# Patient Record
Sex: Female | Born: 1970
Health system: Southern US, Community
[De-identification: ages and names within clinical notes are randomized; demographics above are authoritative.]

## PROBLEM LIST (undated history)

## (undated) DIAGNOSIS — F419 Anxiety disorder, unspecified: Secondary | ICD-10-CM

## (undated) DIAGNOSIS — K654 Sclerosing mesenteritis: Secondary | ICD-10-CM

## (undated) DIAGNOSIS — F32A Depression, unspecified: Secondary | ICD-10-CM

## (undated) DIAGNOSIS — M329 Systemic lupus erythematosus, unspecified: Secondary | ICD-10-CM

## (undated) DIAGNOSIS — IMO0002 Reserved for concepts with insufficient information to code with codable children: Secondary | ICD-10-CM

## (undated) DIAGNOSIS — F329 Major depressive disorder, single episode, unspecified: Secondary | ICD-10-CM

## (undated) HISTORY — DX: Systemic lupus erythematosus, unspecified: M32.9

## (undated) HISTORY — DX: Depression, unspecified: F32.A

## (undated) HISTORY — DX: Sclerosing mesenteritis: K65.4

## (undated) HISTORY — DX: Anxiety disorder, unspecified: F41.9

## (undated) HISTORY — DX: Reserved for concepts with insufficient information to code with codable children: IMO0002

---
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
## 2017-01-21 HISTORY — PX: ESOPHAGOGASTRODUODENOSCOPY: SHX1529

## 2017-02-18 HISTORY — PX: COLONOSCOPY: SHX174

## 2019-09-20 MED FILL — NITROFURANTOIN MONO-MCR 100: 100 | 7 days supply | Qty: 14 | Fill #0

## 2019-10-04 ENCOUNTER — Other Ambulatory Visit: Payer: Self-pay

## 2019-10-04 ENCOUNTER — Ambulatory Visit (INDEPENDENT_AMBULATORY_CARE_PROVIDER_SITE_OTHER): Admitting: Nurse Practitioner

## 2019-10-04 ENCOUNTER — Encounter: Payer: Self-pay | Admitting: Nurse Practitioner

## 2019-10-04 VITALS — BP 126/72 | HR 74 | Temp 98.0°F | Ht 64.0 in | Wt 146.0 lb

## 2019-10-04 DIAGNOSIS — F419 Anxiety disorder, unspecified: Secondary | ICD-10-CM | POA: Insufficient documentation

## 2019-10-04 DIAGNOSIS — F32A Depression, unspecified: Secondary | ICD-10-CM

## 2019-10-04 DIAGNOSIS — K648 Other hemorrhoids: Secondary | ICD-10-CM | POA: Diagnosis not present

## 2019-10-04 DIAGNOSIS — F329 Major depressive disorder, single episode, unspecified: Secondary | ICD-10-CM

## 2019-10-04 DIAGNOSIS — K625 Hemorrhage of anus and rectum: Secondary | ICD-10-CM

## 2019-10-04 DIAGNOSIS — K654 Sclerosing mesenteritis: Secondary | ICD-10-CM

## 2019-10-04 DIAGNOSIS — M329 Systemic lupus erythematosus, unspecified: Secondary | ICD-10-CM | POA: Diagnosis not present

## 2019-10-04 NOTE — Progress Notes (Signed)
10/04/2019 Amanda Mcconnell [id number] [birth year]   HISTORY OF PRESENT ILLNESS: Amanda Mcconnell is a 49 year old female with a past medical history of anxiety, depression and Lupus on Plaquenil. Past C section. She has a history of upper abdominal pain and weight loss for which she underwent an extensive evaluation in 2018.  Abdominal/pelvic CT imaging 11/04/2016 identified mesenteric lymphadenopathy.  She underwent an EGD by Dr. Chales Abrahams 01/21/2017 which identified a small hiatal hernia and mild gastritis.  She underwent a colonoscopy 02/18/2017 which identified small internal hemorrhoids otherwise was normal.  She continued to have surveillance CTs.  She was referred to the [facility] and she underwent an exploratory laparotomy with excision of a mesenteric mass, removal and biopsy of a mesenteric lymph node by Dr. Loraine Leriche Arrendondo 06/21/2019. Biopsies identified  sclerosing mesenteritis.  She was then prescribed Prednisone 7.5 mg once daily.  She presents today with complaints of rectal bleeding.  She reported seeing red blood on the toilet paper and in the toilet water with a few blood clots for 2 days last week.  She last saw bright red blood on the toilet tissue on Sunday 10/02/2019.  She reports having minor anal irritation without significant anal or rectal pain.  She denies NSAID use.  Her bowel movements vary.  She reports passing a bowel movement most days.  Her stool consistency varies, she might pass a normal formed stool, hard or soft stool.  She has chronic periumbilical pain associated with her mesenteritis.  No new abdominal pain.  She complains of having right lower back pain which started the last week of December 2020.  She was treated for possible UTI with Macrobid 1 p.o. twice daily for 7 days in mid December.  She denies having any dysuria or hematuria at this time.  Her water intake is extremely low.  No alcohol or drug use.  She drinks 1 liter of Dr. Reino Kent daily, sometimes more.  She drinks  2 to 4 cups of caffeinated coffee daily.  She smokes 1 pack of cigarettes daily since age of 58.  Her fiber intake is low.  She reports eating junk food several days weekly.  She does eat some vegetables and fruits during the week but not daily.  Reports having heartburn for the past 4 weeks.  She is taking Omeprazole 20 mg once daily which she started a few weeks ago and her heartburn has improved.  No dysphagia.  No weight loss.  No fever but has occasional night sweats.  Her father died from lung or liver cancer with metastasis at the age of 75.  Brother was diagnosed with Crohn's disease in his late 64s to early 18s. No other complaints today.   Current Outpatient Medications on File Prior to Visit  Medication Sig Dispense Refill  . amphetamine-dextroamphetamine (ADDERALL) 30 MG tablet Take by mouth daily.    . citalopram (CELEXA) 20 MG tablet Take 40 mg by mouth daily.    . hydroxychloroquine (PLAQUENIL) 200 MG tablet Take 200 mg by mouth daily.    . predniSONE (DELTASONE) 5 MG tablet Take 7.5 mg by mouth daily with breakfast.     No current facility-administered medications on file prior to visit.  No Known Allergies   REVIEW OF SYSTEMS  : All other systems reviewed and negative except where noted in the History of Present Illness.   PHYSICAL EXAM: BP 126/72   Pulse 74   Temp 98 F (36.7 C)   Ht 5\' 4"  (  1.626 m)   Wt 146 lb (66.2 kg)   BMI 25.06 kg/m  General: Well developed 49 year old female in no acute distress. Head: Normocephalic and atraumatic. Eyes:  Sclerae non-icteric, conjunctive pink. Ears: Normal auditory acuity. Neck: Supple, no lymphadenopathy or thyromegaly.  Lungs: Clear bilaterally to auscultation without wheezes, crackles or rhonchi. Heart: Regular rate and rhythm, no murmur. No rubs or gallops appreciated.  Abdomen: Soft, nondistended, periumbilical tenderness without rebound or guarding.  No masses. No hepatosplenomegaly. Normoactive bowel sounds x 4  quadrants.  Midline abdominal scar intact.  No CVA tenderness. Rectal: No anal fissure assessed.  No external hemorrhoids.  Small internal hemorrhoids palpated right greater than left.  No blood or stool in the rectal vault. Musculoskeletal: Symmetrical with no gross deformities. Skin: Warm and dry. No rash or lesions on visible extremities. Extremities: No edema. Neurological: Alert oriented x 4, no focal deficits.  Psychological:  Alert and cooperative. Normal mood and affect.  ASSESSMENT AND PLAN:  92. 49 year old female with rectal bleeding.  Internal hemorrhoids on exam today without evidence of active bleeding. -Increase water intake to 48 glasses daily -MiraLAX 1 capful mixed in 8 ounces of water at bedtime -Anusol suppository 25 mg 1 PR nightly x5 nights, apply a small amount of Desitin inside the anal opening 2-3 times daily as needed -CBC -Follow-up in office in 4 weeks, to further discuss possible internal  hemorrhoid banding and a flexible sigmoidoscopy to assess above the site of hemorrhoids for cause of rectal bleeding  2.  GERD -Continue Omeprazole 20 mg once daily -GERD diet discussed  3.  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 Elta Guadeloupe Arrendondo at [facility] and Rheumatologist Dr. Annabelle Harman in [place]. On Prednisone, possible plans to start Azathioprine in the near future.   4. History of ? Lupus. ANA negative. 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 Annabelle Harman.  CC:  No ref. provider found

## 2019-10-04 NOTE — Patient Instructions (Addendum)
If you are age 51 or older, your body mass index should be between 23-30. Your Body mass index is 25.06 kg/m. If this is out of the aforementioned range listed, please consider follow up with your Primary Care Provider.  If you are age 38 or younger, your body mass index should be between 19-25. Your Body mass index is 25.06 kg/m. If this is out of the aformentioned range listed, please consider follow up with your Primary Care Provider.   Your provider has requested that you have lab work today. We ask that you go to our [facility] office at [address], [place], [place] [address]. Please enter through the main entrance and go to the elevator.  Press "B" on the elevator. The lab is located at the first door on the left as you exit the elevator.   Increase your water intake by 4-8 glasses of water daily. Increase your daily fiber. Use Miralax 1capful mixed om 8 ounces of water daily  We have sent the following medications to your pharmacy for you to pick up at your convenience:  Anusol suppositories  Use 1 suppository per rectum nightly for 5 days.  Continue with omeprazole 20mg  I daily as needed Use destin apply a small amount inside the anal opening 2-3 times daily as needed.  Follow up in 4-6 weeks to further discuss hemorrhoid banding and a flexible sigmoidoscopy.  Call our office if your symptoms worsen.   Due to recent changes in healthcare laws, you may see the results of your imaging and laboratory studies on MyChart before your provider has had a chance to review them.  We understand that in some cases there may be results that are confusing or concerning to you. Not all laboratory results come back in the same time frame and the provider may be waiting for multiple results in order to interpret others.  Please give 48 hours in order for your provider to thoroughly review all the results before contacting the office for clarification of your results.

## 2019-10-09 NOTE — Progress Notes (Signed)
Excellent plan At follow-up, if hemorrhoid banding is needed, we would set that up with Dr. Barron Alvine. RG

## 2019-11-08 ENCOUNTER — Ambulatory Visit: Admitting: Nurse Practitioner

## 2019-12-07 ENCOUNTER — Other Ambulatory Visit (HOSPITAL_BASED_OUTPATIENT_CLINIC_OR_DEPARTMENT_OTHER): Payer: Self-pay | Admitting: Rheumatology

## 2019-12-07 DIAGNOSIS — K654 Sclerosing mesenteritis: Secondary | ICD-10-CM

## 2019-12-07 DIAGNOSIS — R1900 Intra-abdominal and pelvic swelling, mass and lump, unspecified site: Secondary | ICD-10-CM

## 2019-12-15 ENCOUNTER — Other Ambulatory Visit: Payer: Self-pay

## 2019-12-15 ENCOUNTER — Encounter (HOSPITAL_BASED_OUTPATIENT_CLINIC_OR_DEPARTMENT_OTHER): Payer: Self-pay

## 2019-12-15 ENCOUNTER — Ambulatory Visit (HOSPITAL_BASED_OUTPATIENT_CLINIC_OR_DEPARTMENT_OTHER)
Admission: RE | Admit: 2019-12-15 | Discharge: 2019-12-15 | Disposition: A | Discharge status: Home / Self Care | Source: Ambulatory Visit | Attending: Rheumatology | Admitting: Rheumatology

## 2019-12-15 DIAGNOSIS — K654 Sclerosing mesenteritis: Secondary | ICD-10-CM | POA: Insufficient documentation

## 2019-12-15 DIAGNOSIS — R1900 Intra-abdominal and pelvic swelling, mass and lump, unspecified site: Secondary | ICD-10-CM | POA: Insufficient documentation

## 2019-12-15 IMAGING — CT CT ABD-PELV W/ CM
2 of 5 series · 16 of 46 positions shown, 18 images · IV contrast (Omnipaque)
Comparison: CT of the abdomen pelvis dated [DATE].

CLINICAL DATA: 48-year-old female with history of sclerosing
mesenteritis presenting with abdominal distension and rectal
bleeding.

EXAM:
CT ABDOMEN AND PELVIS WITH CONTRAST
TECHNIQUE: Multidetector CT imaging of the abdomen and pelvis was performed
using the standard protocol following bolus administration of
intravenous contrast.
CONTRAST:  100mL OMNIPAQUE IOHEXOL 300 MG/ML  SOLN

[Series 2: axial st · axial · 0.86mm/px · z∈[-472,-72]mm · 13 of 90 slices shown, 15 images]
[im 5/90  soft-tissue]
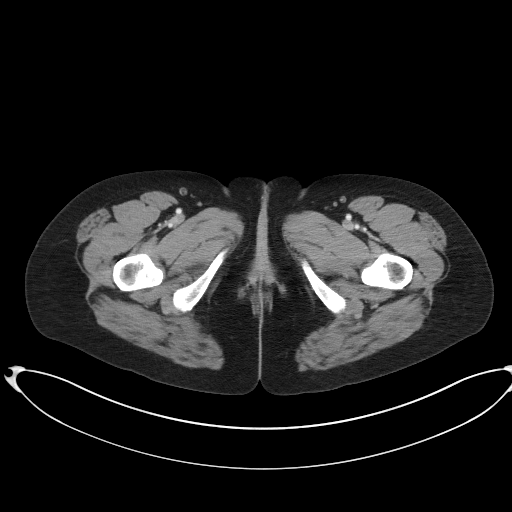
[im 5/90  bone]
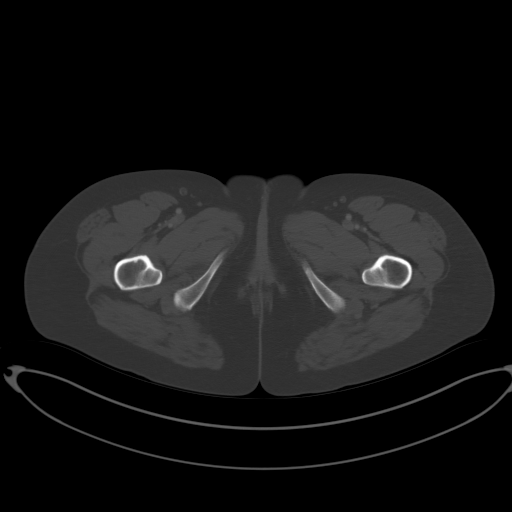
[im 10/90  soft-tissue]
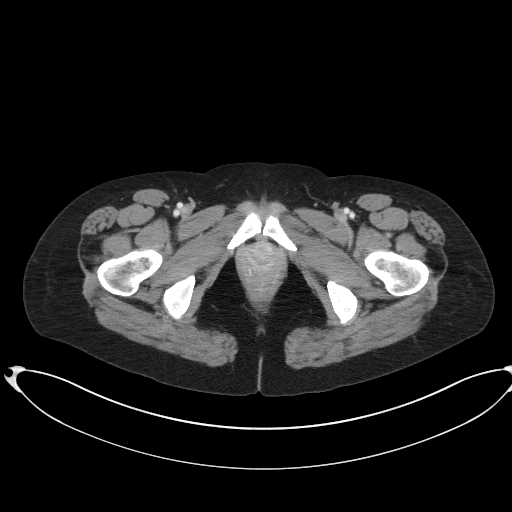
[im 20/90  soft-tissue]
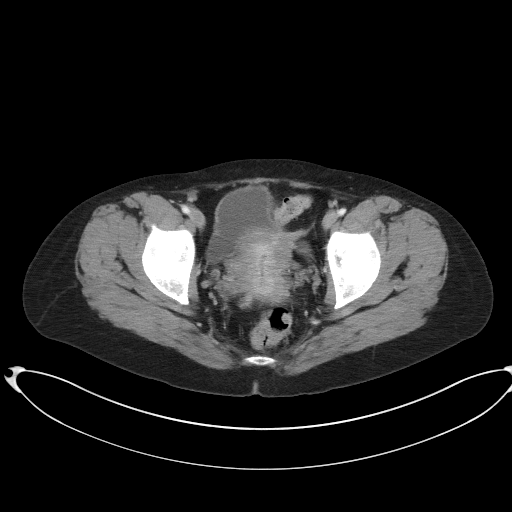
[im 25/90  soft-tissue]
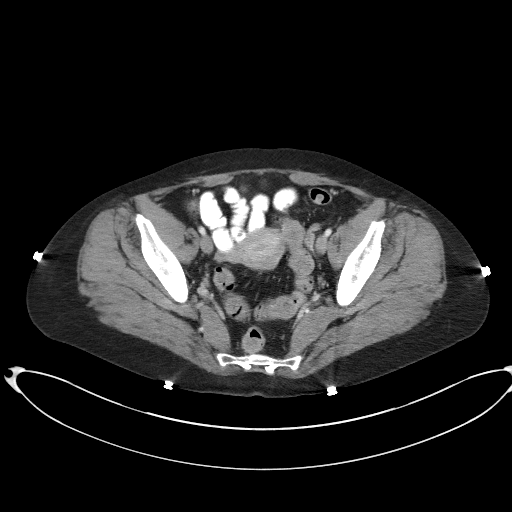
[im 30/90  soft-tissue]
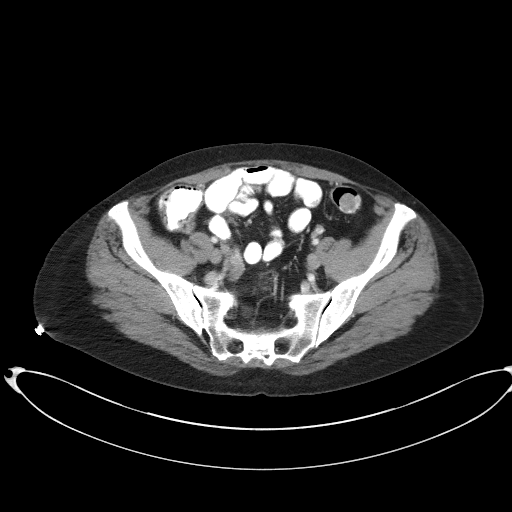
[im 40/90  soft-tissue]
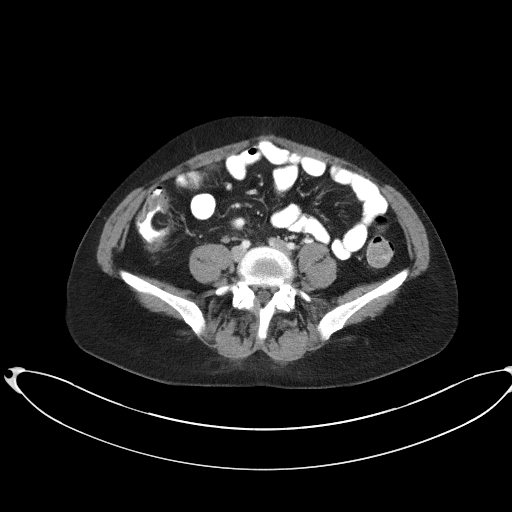
[im 45/90  soft-tissue]
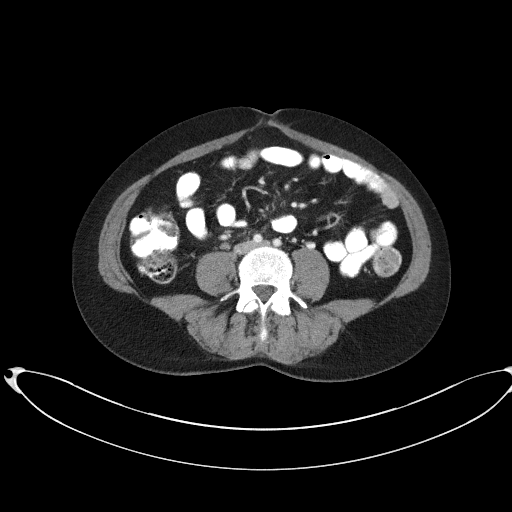
[im 50/90  soft-tissue]
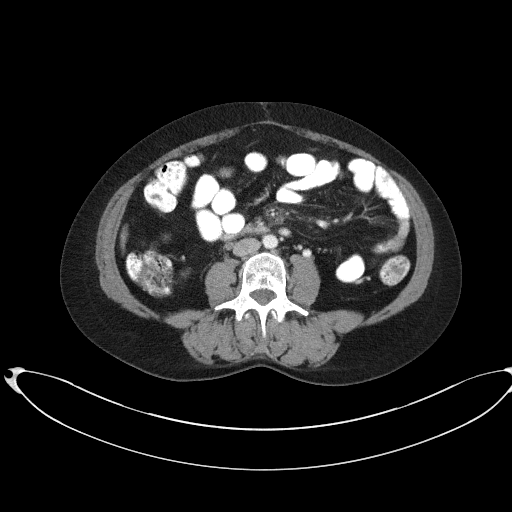
[im 60/90  soft-tissue]
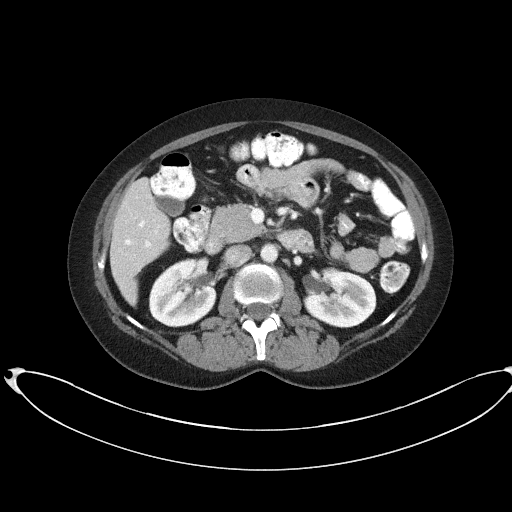
[im 60/90  bone]
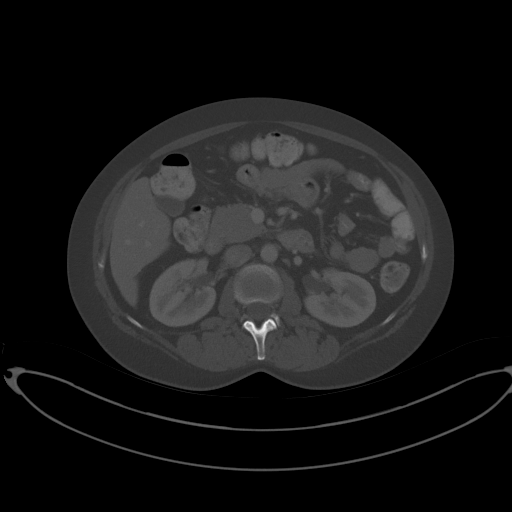
[im 65/90  soft-tissue]
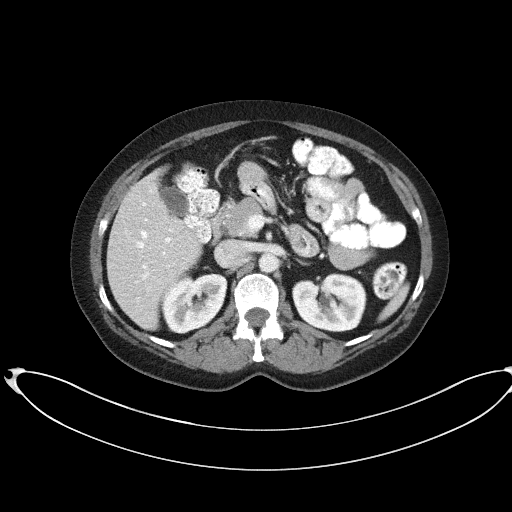
[im 70/90  soft-tissue]
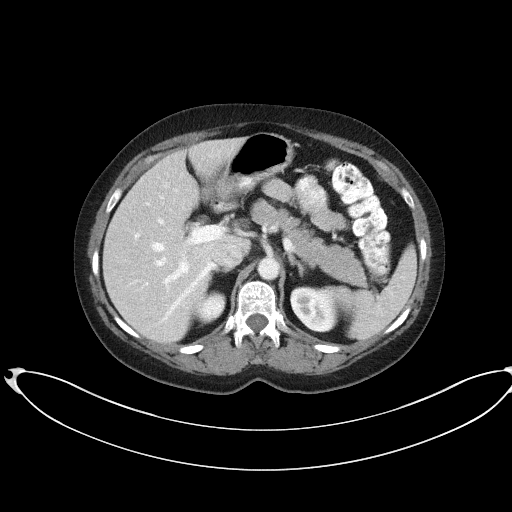
[im 80/90  soft-tissue]
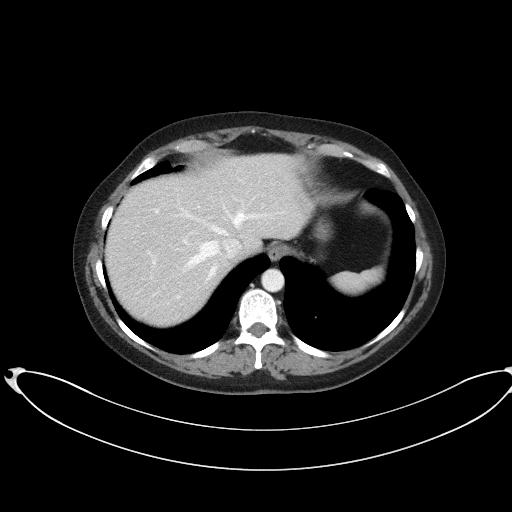
[im 85/90  soft-tissue]
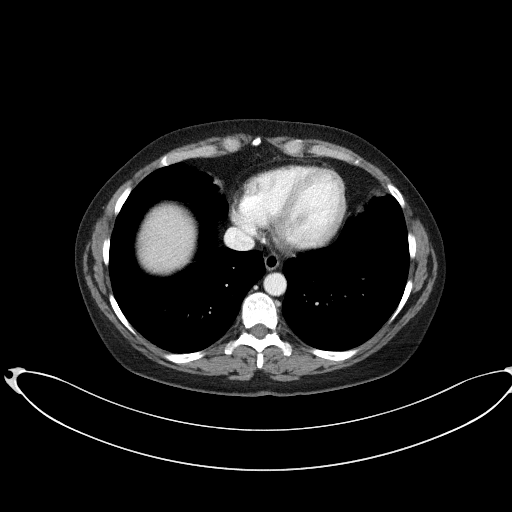

[Series 5: coronal st · coronal · 0.80mm/px · 3 of 87 slices shown]
[im 29/87  soft-tissue]
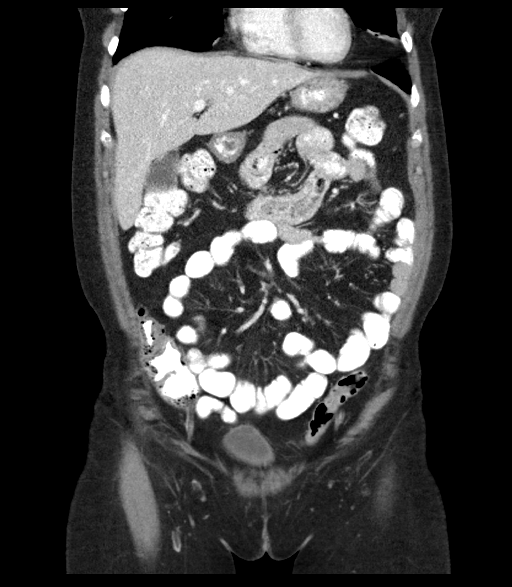
[im 39/87  soft-tissue]
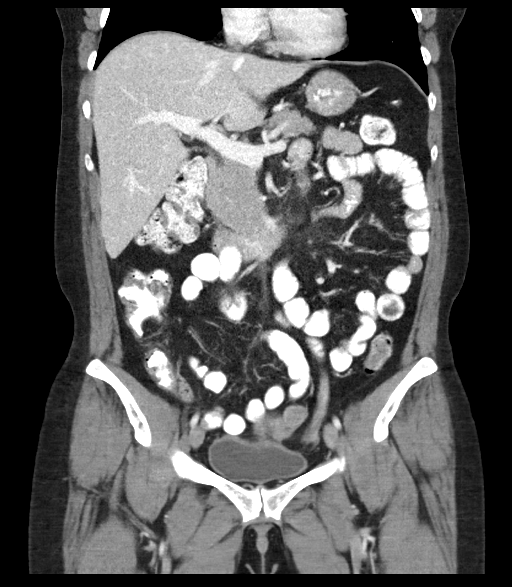
[im 48/87  soft-tissue]
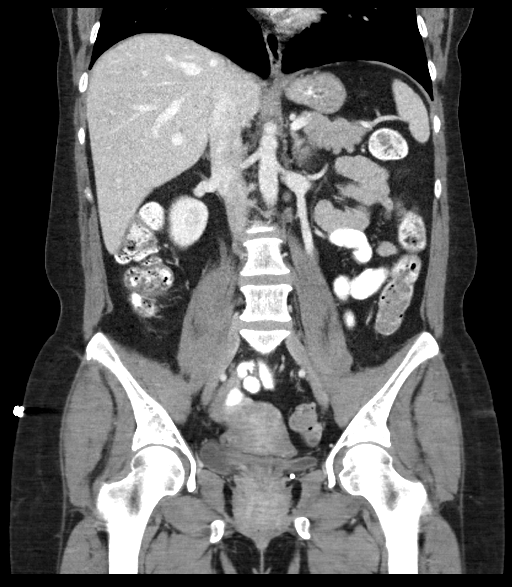

[16 of 46 positions shown; findings below may reference images not displayed]

FINDINGS: Lower chest: The visualized lung bases are clear.

No intra-abdominal free air or free fluid.

Hepatobiliary: No focal liver abnormality is seen. No gallstones,
gallbladder wall thickening, or biliary dilatation.

Pancreas: Unremarkable. No pancreatic ductal dilatation or
surrounding inflammatory changes.

Spleen: Normal in size without focal abnormality.

Adrenals/Urinary Tract: The adrenal glands are unremarkable. The
kidneys, visualized ureters, and urinary bladder appear unremarkable
as well.

Stomach/Bowel: There is no bowel obstruction or active inflammation.
The appendix is normal.

Vascular/Lymphatic: The abdominal aorta and IVC are unremarkable. No
portal venous gas. There is mild haziness of the mesentery in the
upper abdomen. There is an 11 mm nodular density within this area
(series 2, image 34) as well as several additional smaller lymph
nodes with a "misty mesentery" appearance. This finding is
nonspecific but likely related to patient's known sclerosing
mesenteritis. Overall significant interval improvement in the
mesenteric stranding with near complete resolution of the previously
seen enlarged lymph nodes.

Reproductive: The uterus and ovaries are grossly unremarkable.

Other: None

Musculoskeletal: No acute or significant osseous findings.
IMPRESSION: 1. No acute intra-abdominal or pelvic pathology. No bowel
obstruction. Normal appendix.
2. Significant interval improvement of the previously seen
mesenteric stranding with near complete resolution of the associated
adenopathy seen on the prior CT.

## 2019-12-15 MED ORDER — IOHEXOL 300 MG/ML  SOLN
100.0000 mL | Freq: Once | INTRAMUSCULAR | Status: AC | PRN
  Administered 2019-12-15: 14:00:00 100 mL via INTRAVENOUS

## 2020-01-31 ENCOUNTER — Ambulatory Visit: Admitting: Gastroenterology

## 2020-02-07 ENCOUNTER — Ambulatory Visit: Admitting: Gastroenterology

## 2020-02-10 ENCOUNTER — Encounter: Payer: Self-pay | Admitting: Gastroenterology

## 2020-02-10 ENCOUNTER — Ambulatory Visit (INDEPENDENT_AMBULATORY_CARE_PROVIDER_SITE_OTHER): Admitting: Gastroenterology

## 2020-02-10 ENCOUNTER — Other Ambulatory Visit: Payer: Self-pay

## 2020-02-10 VITALS — BP 98/60 | HR 104 | Temp 97.0°F | Ht 64.0 in | Wt 143.0 lb

## 2020-02-10 DIAGNOSIS — K449 Diaphragmatic hernia without obstruction or gangrene: Secondary | ICD-10-CM | POA: Diagnosis not present

## 2020-02-10 DIAGNOSIS — K219 Gastro-esophageal reflux disease without esophagitis: Secondary | ICD-10-CM | POA: Diagnosis not present

## 2020-02-10 MED ORDER — OMEPRAZOLE 20 MG PO CPDR: 20.0000 mg | DELAYED_RELEASE_CAPSULE | Freq: Every day | ORAL | 4 refills | Status: DC

## 2020-02-10 NOTE — Patient Instructions (Signed)
If you are age 51 or older, your body mass index should be between 23-30. Your Body mass index is 24.55 kg/m. If this is out of the aforementioned range listed, please consider follow up with your Primary Care Provider.  If you are age 85 or younger, your body mass index should be between 19-25. Your Body mass index is 24.55 kg/m. If this is out of the aformentioned range listed, please consider follow up with your Primary Care Provider.   We have sent the following medications to your pharmacy for you to pick up at your convenience: Omeprazole 20 mg  Follow up as needed.  Thank you for choosing me and [facility].   Lynann Bologna, MD

## 2020-02-10 NOTE — Progress Notes (Signed)
Chief Complaint: FU  Referring Provider:  Imagene Riches, NP      ASSESSMENT AND PLAN;   #1. GERD with HH. Last EGD 01/2017: small HH.  #2.  Sclerosing mesenteritis/cutaneous lupus. Dx on LN Bx 2020 on pred/MTX/Plaquenil. CT AP 11/2019 with significant improvement.  Being closely followed by Dr. Annabelle Harman at [facility].  Plan: -Omeprazole 20mg  po qd, 1/2hr before breakfast #90, 4 refills. -CT scan reviewed with the patient. -Since she feels significantly better, she would like to hold off on any further GI work-up.  I do agree. -Follow-up in 6 months.  Earlier, if with any problems.   HPI:    Amanda Mcconnell is a 49 y.o. female  With anxiety/depression/asthma/history of sclerosing mesenteritis/discoid lupus. For follow-up visit.  Feels better ever since she stopped ibuprofen. For details see last note by [facility] 10/04/2019   She has been doing much better except for intermittent heartburn.  She has not been taking omeprazole.  She would occasionally complain of epigastric pain which is better currently.  No odynophagia or dysphagia.  She has chronic generalized abdominal pain.  Denies having any significant diarrhea or constipation.  Underwent CT abdomen/pelvis with p.o. and IV contrast 12/15/2019 1. No acute intra-abdominal or pelvic pathology. No bowel obstruction. Normal appendix. 2. Significant interval improvement of the previously seen mesenteric stranding with near complete resolution of the associated adenopathy seen on the prior CT.  EGD 01/29/2017: Small HH, mild gastritis. Neg SB bx for celiac, neg CLO  Colonoscopy 01/2017 ([facility]): Small internal hemorrhoids.  Otherwise normal to TI. Neg random colonic and TI Bx. Rpt in 10 years. Earlier, if with any new problems.   Has also been taking MTX 6/week Past Medical History:  Diagnosis Date  . Anxiety   . Depression   . Lupus ([place])   . Sclerosing mesenteritis [facility])     Past Surgical History:  Procedure Laterality Date  .  CESAREAN SECTION    . COLONOSCOPY  02/18/2017   Small internal hemorrhoids. Otherwise normal colonoscopy to terminal ileum.   . ESOPHAGOGASTRODUODENOSCOPY  01/21/2017   Small hiatal hernia. Mild gastritis    Family History  Problem Relation Age of Onset  . Irritable bowel syndrome Mother   . Lung cancer Father   . Crohn's disease Brother   . Colon cancer Neg Hx     Social History   Tobacco Use  . Smoking status: Current Every Day Smoker    Packs/day: 1.00    Years: 40.00    Pack years: 40.00    Types: Cigarettes  . Smokeless tobacco: Never Used  Substance Use Topics  . Alcohol use: Not Currently    Comment: social  . Drug use: Never    Current Outpatient Medications  Medication Sig Dispense Refill  . amphetamine-dextroamphetamine (ADDERALL) 30 MG tablet Take by mouth daily.    . citalopram (CELEXA) 20 MG tablet Take 40 mg by mouth daily.    . hydroxychloroquine (PLAQUENIL) 200 MG tablet Take 200 mg by mouth daily.    . predniSONE (DELTASONE) 5 MG tablet Take 7.5 mg by mouth daily with breakfast.    . traZODone (DESYREL) 100 MG tablet Take 100 mg by mouth at bedtime.     No current facility-administered medications for this visit.    Allergies  Allergen Reactions  . Doxycycline Swelling  . Penicillins Hives    Review of Systems:  neg     Physical Exam:    BP 98/60   Pulse (!) 104  Temp (!) 97 F (36.1 C)   Ht 5\' 4"  (1.626 m)   Wt 143 lb (64.9 kg)   BMI 24.55 kg/m  Wt Readings from Last 3 Encounters:  02/10/20 143 lb (64.9 kg)  10/04/19 146 lb (66.2 kg)   Constitutional:  Well-developed, in no acute distress. Psychiatric: Normal mood and affect. Behavior is normal. HEENT: Pupils normal.  Conjunctivae are normal. No scleral icterus. Neck supple.  Cardiovascular: Normal rate, regular rhythm. No edema Pulmonary/chest: Effort normal and breath sounds normal. No wheezing, rales or rhonchi. Abdominal: Soft, nondistended.  Has abdominal wall  tenderness.. Bowel sounds active throughout. There are no masses palpable. No hepatomegaly. Rectal:  defered Neurological: Alert and oriented to person place and time. Skin: Skin is warm and dry. No rashes noted.    12/02/19, MD 02/10/2020, 2:33 PM  Cc: 02/12/2020, NP

## 2020-03-15 ENCOUNTER — Ambulatory Visit (HOSPITAL_BASED_OUTPATIENT_CLINIC_OR_DEPARTMENT_OTHER)
Admission: RE | Admit: 2020-03-15 | Discharge: 2020-03-15 | Disposition: A | Discharge status: Home / Self Care | Source: Ambulatory Visit | Attending: Family | Admitting: Family

## 2020-03-15 ENCOUNTER — Other Ambulatory Visit: Payer: Self-pay

## 2020-03-15 ENCOUNTER — Other Ambulatory Visit (HOSPITAL_BASED_OUTPATIENT_CLINIC_OR_DEPARTMENT_OTHER): Payer: Self-pay | Admitting: Family

## 2020-03-15 DIAGNOSIS — Z1231 Encounter for screening mammogram for malignant neoplasm of breast: Secondary | ICD-10-CM | POA: Insufficient documentation

## 2020-03-15 DIAGNOSIS — N959 Unspecified menopausal and perimenopausal disorder: Secondary | ICD-10-CM

## 2020-03-15 IMAGING — MG DIGITAL SCREENING BILAT W/ TOMO W/ CAD
6 of 10 series · 6 of 30 positions shown · non-contrast
Comparison: Previous exam(s).

CLINICAL DATA: Screening.

EXAM:
DIGITAL SCREENING BILATERAL MAMMOGRAM WITH TOMO AND CAD

[R MLO synth-2D]
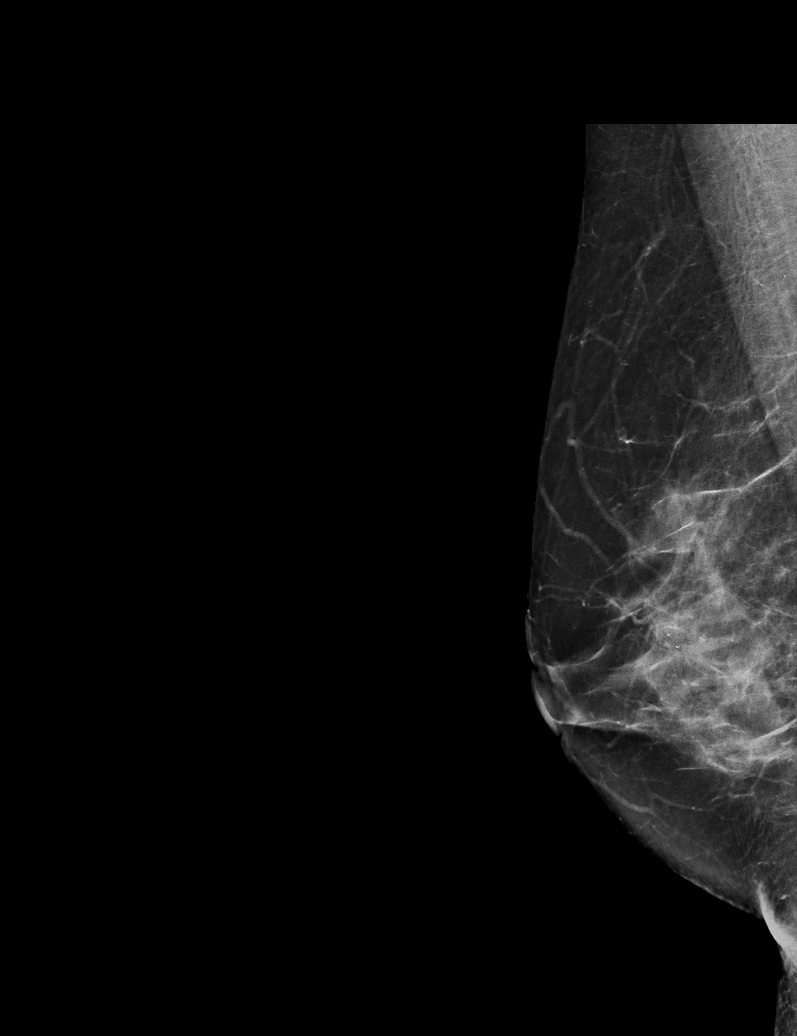

[R XCCL synth-2D]
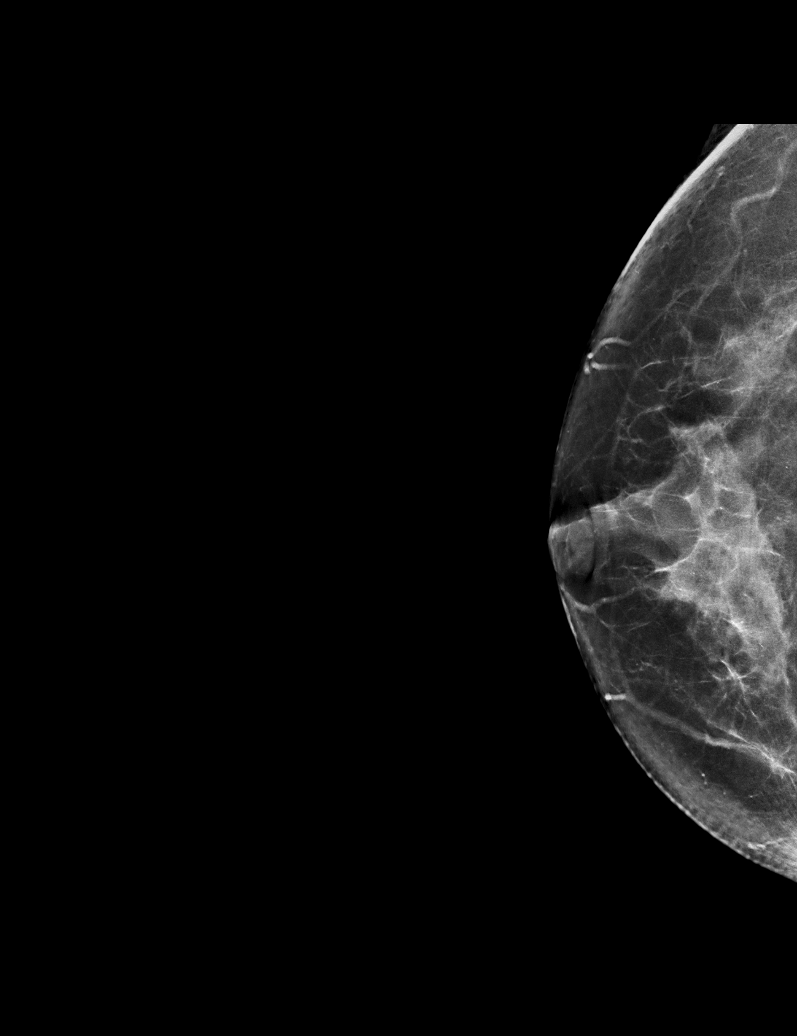

[L MLO synth-2D]
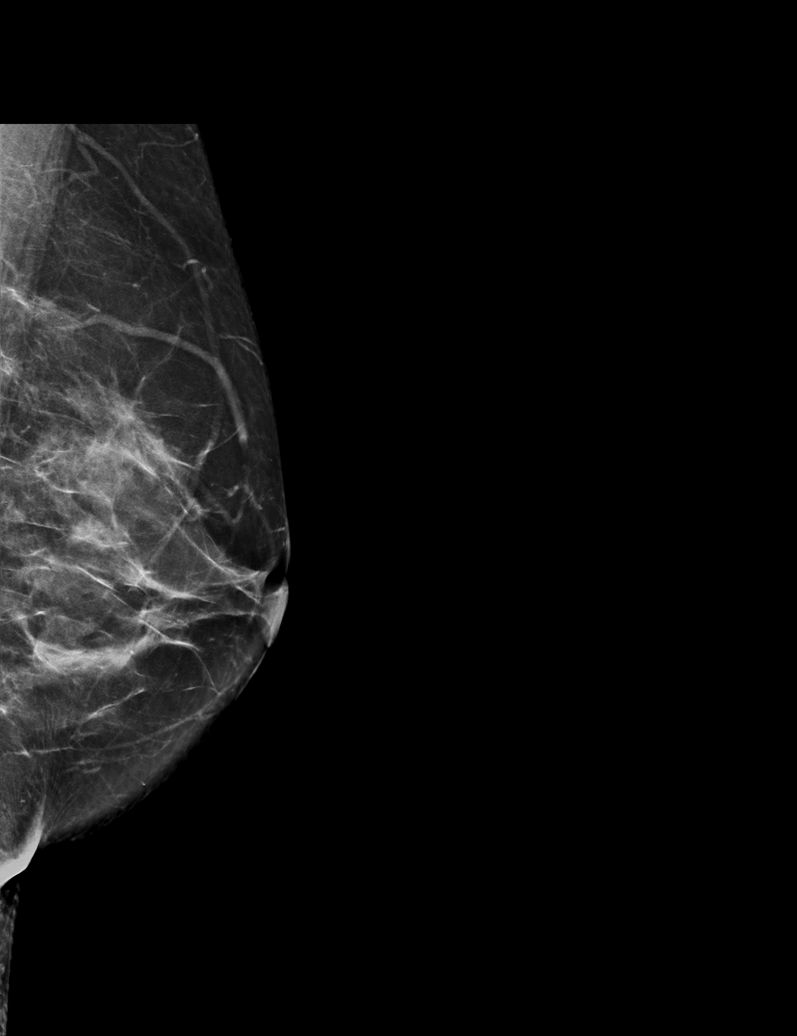

[L CC synth-2D]
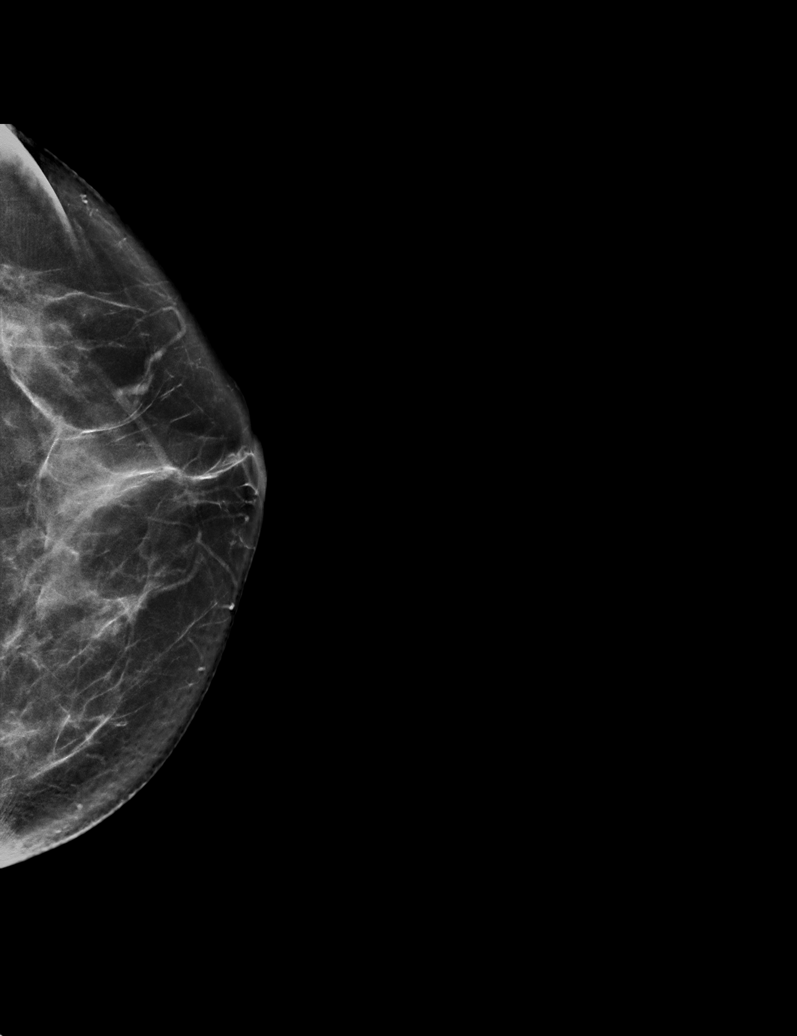

[R CC synth-2D]
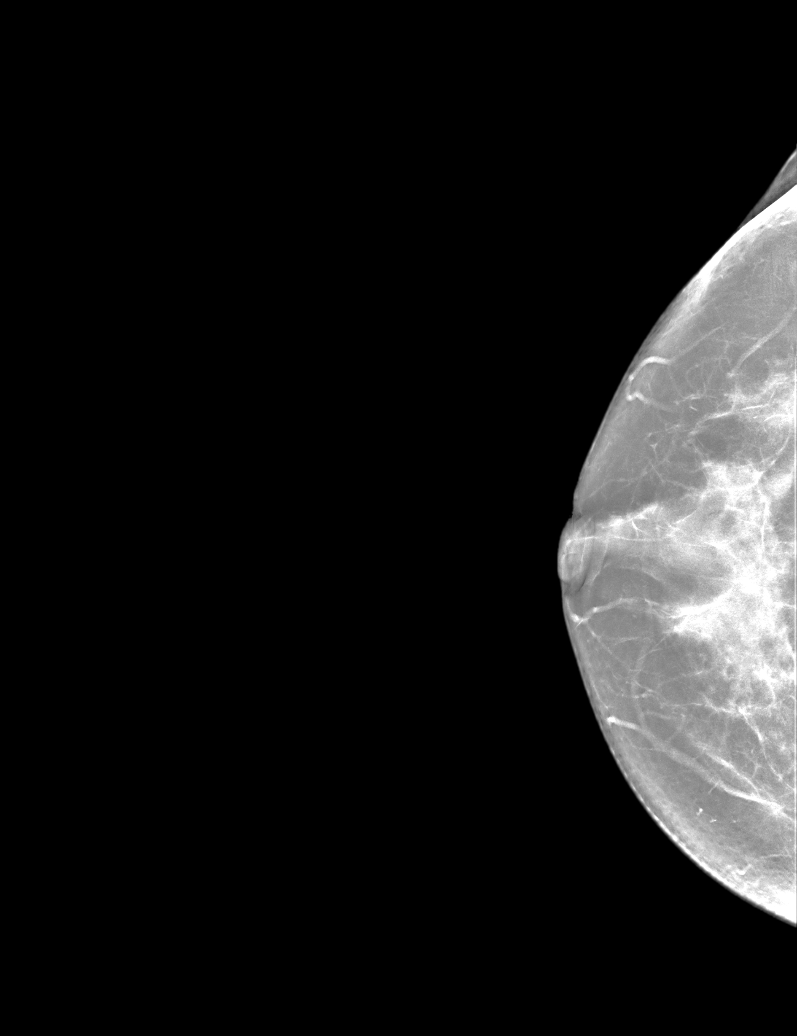

[R CC tomo · tomo slice 33/65.0]
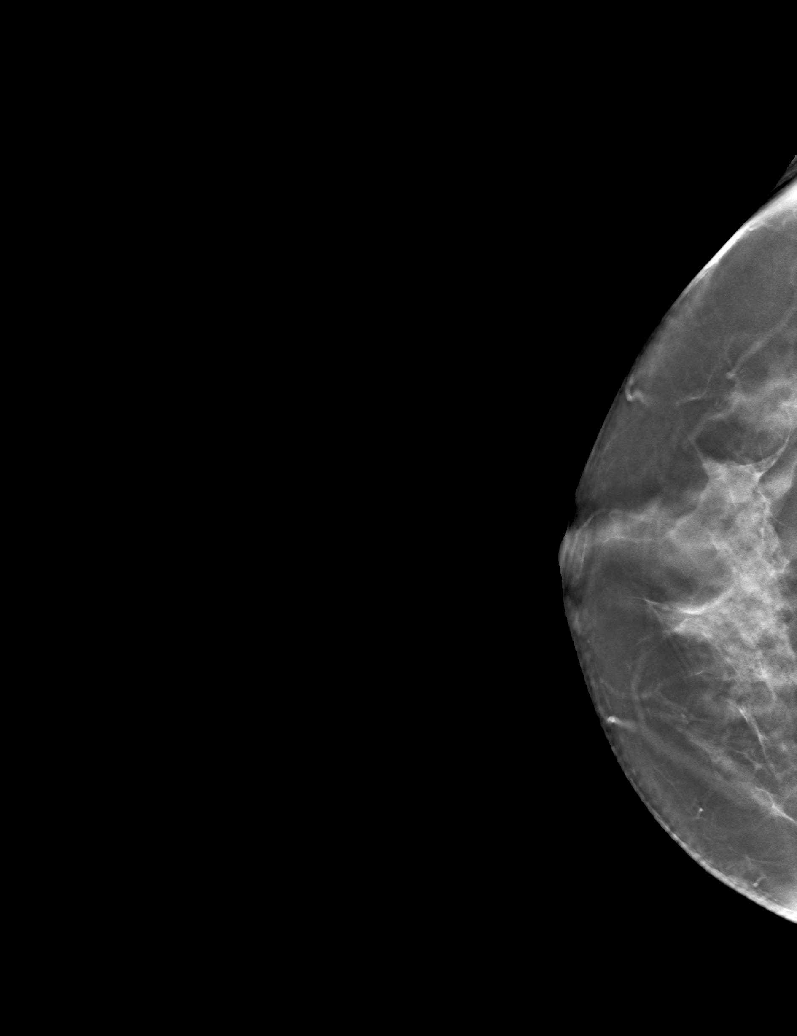

[6 of 30 positions shown; findings below may reference images not displayed]

ACR Breast Density Category c: The breast tissue is heterogeneously
dense, which may obscure small masses.
FINDINGS: There are no findings suspicious for malignancy. Images were
processed with CAD.
IMPRESSION: No mammographic evidence of malignancy. A result letter of this
screening mammogram will be mailed directly to the patient.

RECOMMENDATION:
Screening mammogram in one year. (Code:[5V])

BI-RADS CATEGORY  1: Negative.

## 2020-06-25 ENCOUNTER — Other Ambulatory Visit: Payer: Self-pay

## 2020-06-25 ENCOUNTER — Encounter: Payer: Self-pay | Admitting: Hematology & Oncology

## 2020-06-25 ENCOUNTER — Inpatient Hospital Stay (HOSPITAL_BASED_OUTPATIENT_CLINIC_OR_DEPARTMENT_OTHER): Admitting: Hematology & Oncology

## 2020-06-25 ENCOUNTER — Inpatient Hospital Stay: Attending: Hematology & Oncology

## 2020-06-25 VITALS — BP 106/66 | HR 102 | Temp 98.9°F | Resp 20 | Ht 64.5 in | Wt 127.0 lb

## 2020-06-25 DIAGNOSIS — R634 Abnormal weight loss: Secondary | ICD-10-CM

## 2020-06-25 DIAGNOSIS — Z79899 Other long term (current) drug therapy: Secondary | ICD-10-CM | POA: Diagnosis not present

## 2020-06-25 DIAGNOSIS — M329 Systemic lupus erythematosus, unspecified: Secondary | ICD-10-CM

## 2020-06-25 DIAGNOSIS — F17210 Nicotine dependence, cigarettes, uncomplicated: Secondary | ICD-10-CM

## 2020-06-25 DIAGNOSIS — T82868S Thrombosis of vascular prosthetic devices, implants and grafts, sequela: Secondary | ICD-10-CM

## 2020-06-25 LAB — CBC WITH DIFFERENTIAL (CANCER CENTER ONLY)
Abs Immature Granulocytes: 0.13 10*3/uL — ABNORMAL HIGH (ref 0.00–0.07)
Basophils Absolute: 0.1 10*3/uL (ref 0.0–0.1)
Basophils Relative: 1 %
Eosinophils Absolute: 0.1 10*3/uL (ref 0.0–0.5)
Eosinophils Relative: 1 %
HCT: 33.5 % — ABNORMAL LOW (ref 36.0–46.0)
Hemoglobin: 10.5 g/dL — ABNORMAL LOW (ref 12.0–15.0)
Immature Granulocytes: 1 %
Lymphocytes Relative: 7 %
Lymphs Abs: 0.9 10*3/uL (ref 0.7–4.0)
MCH: 25.3 pg — ABNORMAL LOW (ref 26.0–34.0)
MCHC: 31.3 g/dL (ref 30.0–36.0)
MCV: 80.7 fL (ref 80.0–100.0)
Monocytes Absolute: 0.5 10*3/uL (ref 0.1–1.0)
Monocytes Relative: 4 %
Neutro Abs: 11.5 10*3/uL — ABNORMAL HIGH (ref 1.7–7.7)
Neutrophils Relative %: 86 %
Platelet Count: 374 10*3/uL (ref 150–400)
RBC: 4.15 MIL/uL (ref 3.87–5.11)
RDW: 15.5 % (ref 11.5–15.5)
WBC Count: 13.3 10*3/uL — ABNORMAL HIGH (ref 4.0–10.5)
nRBC: 0 % (ref 0.0–0.2)

## 2020-06-25 LAB — CMP (CANCER CENTER ONLY)
ALT: 27 U/L (ref 0–44)
AST: 18 U/L (ref 15–41)
Albumin: 4 g/dL (ref 3.5–5.0)
Alkaline Phosphatase: 126 U/L (ref 38–126)
Anion gap: 9 (ref 5–15)
BUN: 8 mg/dL (ref 6–20)
CO2: 27 mmol/L (ref 22–32)
Calcium: 9.6 mg/dL (ref 8.9–10.3)
Chloride: 98 mmol/L (ref 98–111)
Creatinine: 0.72 mg/dL (ref 0.44–1.00)
GFR, Est AFR Am: >60 mL/min (ref >60)
GFR, Estimated: >60 mL/min (ref >60)
Glucose, Bld: 137 mg/dL — ABNORMAL HIGH (ref 70–99)
Potassium: 3.6 mmol/L (ref 3.5–5.1)
Sodium: 134 mmol/L — ABNORMAL LOW (ref 135–145)
Total Bilirubin: 0.5 mg/dL (ref 0.3–1.2)
Total Protein: 7 g/dL (ref 6.5–8.1)

## 2020-06-25 NOTE — Progress Notes (Signed)
Referral MD  Reason for Referral: Splenic infarct; weight loss, discoid lupus, sclerosing mesenteritis  Chief Complaint  Patient presents with  . New Patient (Initial Visit)    Blood clots.  : I have been losing weight.  I have had a blood clot in my spleen.  HPI: Amanda Mcconnell is a very nice 49 year old white female.  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  She has been down there for the a couple years.  She is always nice to talk to.  She always appeared to be healthy to me.  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  Also has this rare diagnosis of sclerosing mesenteritis.  I think she is on methotrexate for this.  She had been on Plaquenil for the discoid lupus.  She does smoke.  She has about a 30-pack-year history of tobacco use.  She still smokes a pack per day.  She says that she is lost about 20 pounds.  She does her appetite is down.  She been having some temperatures.  Is been having some sweats.  Back in I think July, she can have some pain over on the right upper abdomen.  She ultimately went to the emergency room.  I think she had a noncontrast scan done which showed the possibility of lesions in the spleen and liver.  On August 4, she had a contrasted CT scan.  The CT scan of the abdomen and pelvis showed possible thrombus in the dome of the liver.  She did have splenic infarct.  There appeared to be some thrombus in the splenic vein.  There is no adenopathy.  There is no cirrhotic changes.  She had a factor V Leiden mutation done.  This was negative.  She was placed on Eliquis.  She is taking Eliquis right now.  She has had no bleeding.  She has had no increase in pain.  She was then referred to the [facility] because of this possibility of malignancy.  She has not had a dedicated CT scan of the chest as far as I can tell.  She did have a CT angiogram of the chest abdomen and pelvis on August 20.  This did not show anything in the  lungs or mediastinum.  I am not sure we will ever find an obvious malignancy.  There is a family history of cancer.  I think her father had malignancy.  She is not sure where it started.  She has had no problems with skin cancer.  She has very fair skin.  She never had any melanoma.  There is no obvious change in bowel or bladder habits.  She is up-to-date with her mammograms and with her colonoscopy.  She is not a vegetarian.  Overall, her performance status is probably ECOG 1.   Past Medical History:  Diagnosis Date  . Anxiety   . Depression   . Lupus (HCC)   . Sclerosing mesenteritis (HCC)   :  Past Surgical History:  Procedure Laterality Date  . CESAREAN SECTION    . COLONOSCOPY  02/18/2017   Small internal hemorrhoids. Otherwise normal colonoscopy to terminal ileum.   . ESOPHAGOGASTRODUODENOSCOPY  01/21/2017   Small hiatal hernia. Mild gastritis  :   Current Outpatient Medications:  .  amphetamine-dextroamphetamine (ADDERALL) 30 MG tablet, Take by mouth daily., Disp: , Rfl:  .  APIXABAN (ELIQUIS) VTE STARTER PACK (10MG  AND 5MG ), Take 2 tablets (10mg ) by mouth twice daily for 7 days, then  take 1 tablet (5 mg) twice daily., Disp: , Rfl:  .  citalopram (CELEXA) 20 MG tablet, Take 40 mg by mouth daily., Disp: , Rfl:  .  ferrous sulfate 325 (65 FE) MG EC tablet, Take 325 mg by mouth in the morning and at bedtime., Disp: , Rfl:  .  folic acid (FOLVITE) 1 MG tablet, Take by mouth., Disp: , Rfl:  .  hydrocortisone 2.5 % cream, Apply to rash twice a day as needed, Disp: , Rfl:  .  hydroxychloroquine (PLAQUENIL) 200 MG tablet, Take 200 mg by mouth daily., Disp: , Rfl:  .  methotrexate (RHEUMATREX) 2.5 MG tablet, Take by mouth., Disp: , Rfl:  .  oxyCODONE-acetaminophen (PERCOCET/ROXICET) 5-325 MG tablet, Take 1 tablet by mouth every 4 (four) hours., Disp: , Rfl:  .  predniSONE (DELTASONE) 5 MG tablet, Take 5 mg by mouth daily with breakfast. , Disp: , Rfl:  .  traZODone  (DESYREL) 100 MG tablet, Take 100 mg by mouth at bedtime., Disp: , Rfl:  .  albuterol (PROVENTIL) (2.5 MG/3ML) 0.083% nebulizer solution, Take 2.5 mg by nebulization every 6 (six) hours as needed. (Patient not taking: Reported on 06/25/2020), Disp: , Rfl:  .  omeprazole (PRILOSEC) 20 MG capsule, Take 1 capsule (20 mg total) by mouth daily. Take 30 minutes before breakfast. (Patient not taking: Reported on 06/25/2020), Disp: 90 capsule, Rfl: 4 .  ondansetron (ZOFRAN-ODT) 4 MG disintegrating tablet, Take 4 mg by mouth every 6 (six) hours as needed. (Patient not taking: Reported on 06/25/2020), Disp: , Rfl: :  :  Allergies  Allergen Reactions  . Doxycycline Swelling  . Penicillins Hives  :  Family History  Problem Relation Age of Onset  . Irritable bowel syndrome Mother   . Lung cancer Father   . Crohn's disease Brother   . Colon cancer Neg Hx   :  Social History   Socioeconomic History  . Marital status: Single    Spouse name: Not on file  . Number of children: Not on file  . Years of education: Not on file  . Highest education level: Not on file  Occupational History  . Not on file  Tobacco Use  . Smoking status: Current Every Day Smoker    Packs/day: 1.00    Years: 40.00    Pack years: 40.00    Types: Cigarettes  . Smokeless tobacco: Never Used  Vaping Use  . Vaping Use: Never used  Substance and Sexual Activity  . Alcohol use: Not Currently    Comment: social  . Drug use: Never  . Sexual activity: Not on file  Other Topics Concern  . Not on file  Social History Narrative  . Not on file   Social Determinants of Health   Financial Resource Strain:   . Difficulty of Paying Living Expenses: Not on file  Food Insecurity:   . Worried About Programme researcher, broadcasting/film/video in the Last Year: Not on file  . Ran Out of Food in the Last Year: Not on file  Transportation Needs:   . Lack of Transportation (Medical): Not on file  . Lack of Transportation (Non-Medical): Not on file   Physical Activity:   . Days of Exercise per Week: Not on file  . Minutes of Exercise per Session: Not on file  Stress:   . Feeling of Stress : Not on file  Social Connections:   . Frequency of Communication with Friends and Family: Not on file  . Frequency of Social  Gatherings with Friends and Family: Not on file  . Attends Religious Services: Not on file  . Active Member of Clubs or Organizations: Not on file  . Attends Banker Meetings: Not on file  . Marital Status: Not on file  Intimate Partner Violence:   . Fear of Current or Ex-Partner: Not on file  . Emotionally Abused: Not on file  . Physically Abused: Not on file  . Sexually Abused: Not on file  :  Review of Systems  Constitutional: Positive for fever, malaise/fatigue and weight loss.  HENT: Negative.   Eyes: Negative.   Respiratory: Negative.   Cardiovascular: Negative.   Gastrointestinal: Positive for abdominal pain.  Genitourinary: Negative.   Musculoskeletal: Negative.   Skin: Negative.   Neurological: Negative.   Endo/Heme/Allergies: Negative.   Psychiatric/Behavioral: Negative.      Exam:  This is a well-developed and well-nourished white female in no obvious distress.  Vital signs show a temperature of 98.9.  Pulse 102.  Blood pressure 106/66.  Weight is 127 pounds.  Head and neck exam shows no ocular or oral lesions.  She has no adenopathy in the neck.  Thyroid is nonpalpable.  Lungs are clear bilaterally.  Cardiac exam regular rate and rhythm.  She had no murmurs, rubs or bruits.  Abdomen is soft.  There are some tenderness in the left upper quadrant.  She has no fluid wave.  She has healed laparoscopy scar in the midline.  She has no subcutaneous nodules.  There is no inguinal adenopathy bilaterally.  She has no palpable liver or spleen tip.  Back exam shows no tenderness over the spine, ribs or hips.  Strategies shows no clubbing, cyanosis or edema.  She has no venous cord in the legs.  She has  no Homans' sign bilaterally.  She has good strength in upper and lower extremities.  Skin exam does show fair skin.  I do not see any suspicious skin lesions.  Neurological exam shows no obvious neurological deficits.    @IPVITALS @   Recent Labs    06/25/20 1541  WBC 13.3*  HGB 10.5*  HCT 33.5*  PLT 374   Recent Labs    06/25/20 1541  NA 134*  K 3.6  CL 98  CO2 27  GLUCOSE 137*  BUN 8  CREATININE 0.72  CALCIUM 9.6    Blood smear review: None  Pathology: None    Assessment and Plan: Amanda Mcconnell is a nice 49 year old white female.  She has these splenic infarcts.  There may be some been in the liver that might be a thrombus.  She has no cirrhosis.  It is hard to say what is going on.  She does smoke.  She would be at high risk for malignancy.  I would think with all the scans that she has had done so far, a cancer would have been found.  I do not think we have to do any further scans on her.  We do have to do a hypercoagulable work-up on her.  She has not had one done as of yet.  I think that we probably should get Dopplers of her legs just to make sure nothing is going on in her legs.  I do not know if her thyroids been checked or not.  This might explain the weight loss.  She is certainly appears to be iron deficient from her labs.  I would think being on methotrexate, her MCV should be a whole lot higher.  Again I am not sure when she actually had a colonoscopy.  This may be something to think about if she has not had one for a while.  We will have to see what the hypercoagulable studies show.  How long to keep her on blood thinner is not clear.  We will have to see what her hypercoagulable studies show.  I do not see that we have to get on anything for her appetite.  She seems to be eating okay.  She is very nice.  Is always nice to talk with her when she is down in the lobby.  I did give her a prayer blanket.  She is very grateful for this.

## 2020-06-26 ENCOUNTER — Telehealth: Payer: Self-pay | Admitting: Hematology & Oncology

## 2020-06-26 LAB — LACTATE DEHYDROGENASE: LDH: 168 U/L (ref 98–192)

## 2020-06-26 NOTE — Telephone Encounter (Signed)
Called and advised patient of appointments scheduled per 10/4 los

## 2020-06-29 ENCOUNTER — Other Ambulatory Visit: Payer: Self-pay

## 2020-06-29 ENCOUNTER — Inpatient Hospital Stay: Attending: Hematology & Oncology

## 2020-06-29 DIAGNOSIS — D735 Infarction of spleen: Secondary | ICD-10-CM | POA: Insufficient documentation

## 2020-06-29 DIAGNOSIS — T82868S Thrombosis of vascular prosthetic devices, implants and grafts, sequela: Secondary | ICD-10-CM

## 2020-06-29 DIAGNOSIS — D508 Other iron deficiency anemias: Secondary | ICD-10-CM | POA: Diagnosis not present

## 2020-06-29 DIAGNOSIS — K909 Intestinal malabsorption, unspecified: Secondary | ICD-10-CM | POA: Diagnosis not present

## 2020-06-29 DIAGNOSIS — M329 Systemic lupus erythematosus, unspecified: Secondary | ICD-10-CM

## 2020-06-29 LAB — ANTITHROMBIN III: AntiThromb III Func: 114 % (ref 75–120)

## 2020-06-30 LAB — PROTEIN C, TOTAL: Protein C, Total: 96 % (ref 60–150)

## 2020-06-30 LAB — HOMOCYSTEINE: Homocysteine: 11.8 umol/L (ref 0.0–14.5)

## 2020-06-30 LAB — PROTEIN C ACTIVITY: Protein C Activity: 123 % (ref 73–180)

## 2020-06-30 LAB — PROTEIN S, TOTAL: Protein S Ag, Total: 126 % (ref 60–150)

## 2020-06-30 LAB — PROTEIN S ACTIVITY: Protein S Activity: 108 % (ref 63–140)

## 2020-07-01 LAB — LUPUS ANTICOAGULANT PANEL
DRVVT: 67.4 s — ABNORMAL HIGH (ref 0.0–47.0)
PTT Lupus Anticoagulant: 37.5 s (ref 0.0–51.9)

## 2020-07-01 LAB — BETA-2-GLYCOPROTEIN I ABS, IGG/M/A
Beta-2 Glyco I IgG: <9 GPI IgG units (ref 0–20)
Beta-2-Glycoprotein I IgA: <9 GPI IgA units (ref 0–25)
Beta-2-Glycoprotein I IgM: <9 GPI IgM units (ref 0–32)

## 2020-07-01 LAB — DRVVT CONFIRM: dRVVT Confirm: 1.1 ratio (ref 0.8–1.2)

## 2020-07-01 LAB — CARDIOLIPIN ANTIBODIES, IGG, IGM, IGA
Anticardiolipin IgA: <9 APL U/mL (ref 0–11)
Anticardiolipin IgG: <9 GPL U/mL (ref 0–14)
Anticardiolipin IgM: 13 MPL U/mL — ABNORMAL HIGH (ref 0–12)

## 2020-07-01 LAB — DRVVT MIX: dRVVT Mix: 51.2 s — ABNORMAL HIGH (ref 0.0–40.4)

## 2020-07-02 LAB — IRON AND TIBC
Iron: 15 ug/dL — ABNORMAL LOW (ref 41–142)
Saturation Ratios: 6 % — ABNORMAL LOW (ref 21–57)
TIBC: 249 ug/dL (ref 236–444)
UIBC: 234 ug/dL (ref 120–384)

## 2020-07-02 LAB — TSH: TSH: 0.623 u[IU]/mL (ref 0.308–3.960)

## 2020-07-02 LAB — FERRITIN: Ferritin: 250 ng/mL (ref 11–307)

## 2020-07-04 LAB — FACTOR 5 LEIDEN

## 2020-07-04 LAB — PROTHROMBIN GENE MUTATION

## 2020-07-19 ENCOUNTER — Other Ambulatory Visit (HOSPITAL_BASED_OUTPATIENT_CLINIC_OR_DEPARTMENT_OTHER): Payer: Self-pay | Admitting: Internal Medicine

## 2020-07-19 MED FILL — FLUARIX QUADRIVALENT 0.5 ML: 0.5 | 1 days supply | Qty: 1 | Fill #0

## 2020-07-26 ENCOUNTER — Other Ambulatory Visit: Payer: Self-pay

## 2020-07-26 ENCOUNTER — Inpatient Hospital Stay: Attending: Hematology & Oncology

## 2020-07-26 ENCOUNTER — Inpatient Hospital Stay (HOSPITAL_BASED_OUTPATIENT_CLINIC_OR_DEPARTMENT_OTHER): Admitting: Hematology & Oncology

## 2020-07-26 ENCOUNTER — Encounter: Payer: Self-pay | Admitting: Hematology & Oncology

## 2020-07-26 VITALS — BP 101/62 | HR 87 | Temp 98.2°F | Resp 16 | Wt 127.0 lb

## 2020-07-26 DIAGNOSIS — D500 Iron deficiency anemia secondary to blood loss (chronic): Secondary | ICD-10-CM

## 2020-07-26 DIAGNOSIS — M329 Systemic lupus erythematosus, unspecified: Secondary | ICD-10-CM

## 2020-07-26 DIAGNOSIS — T82868S Thrombosis of vascular prosthetic devices, implants and grafts, sequela: Secondary | ICD-10-CM

## 2020-07-26 DIAGNOSIS — K654 Sclerosing mesenteritis: Secondary | ICD-10-CM | POA: Diagnosis not present

## 2020-07-26 DIAGNOSIS — K909 Intestinal malabsorption, unspecified: Secondary | ICD-10-CM | POA: Diagnosis not present

## 2020-07-26 DIAGNOSIS — D735 Infarction of spleen: Secondary | ICD-10-CM | POA: Insufficient documentation

## 2020-07-26 HISTORY — DX: Iron deficiency anemia secondary to blood loss (chronic): D50.0

## 2020-07-26 HISTORY — DX: Intestinal malabsorption, unspecified: K90.9

## 2020-07-26 HISTORY — DX: Infarction of spleen: D73.5

## 2020-07-26 LAB — CBC WITH DIFFERENTIAL (CANCER CENTER ONLY)
Abs Immature Granulocytes: 0.07 10*3/uL (ref 0.00–0.07)
Basophils Absolute: 0.1 10*3/uL (ref 0.0–0.1)
Basophils Relative: 1 %
Eosinophils Absolute: 0.2 10*3/uL (ref 0.0–0.5)
Eosinophils Relative: 2 %
HCT: 34.1 % — ABNORMAL LOW (ref 36.0–46.0)
Hemoglobin: 10.7 g/dL — ABNORMAL LOW (ref 12.0–15.0)
Immature Granulocytes: 1 %
Lymphocytes Relative: 20 %
Lymphs Abs: 1.9 10*3/uL (ref 0.7–4.0)
MCH: 25.4 pg — ABNORMAL LOW (ref 26.0–34.0)
MCHC: 31.4 g/dL (ref 30.0–36.0)
MCV: 81 fL (ref 80.0–100.0)
Monocytes Absolute: 0.4 10*3/uL (ref 0.1–1.0)
Monocytes Relative: 5 %
Neutro Abs: 6.8 10*3/uL (ref 1.7–7.7)
Neutrophils Relative %: 71 %
Platelet Count: 357 10*3/uL (ref 150–400)
RBC: 4.21 MIL/uL (ref 3.87–5.11)
RDW: 16.6 % — ABNORMAL HIGH (ref 11.5–15.5)
WBC Count: 9.5 10*3/uL (ref 4.0–10.5)
nRBC: 0 % (ref 0.0–0.2)

## 2020-07-26 LAB — CMP (CANCER CENTER ONLY)
ALT: 10 U/L (ref 0–44)
AST: 14 U/L — ABNORMAL LOW (ref 15–41)
Albumin: 4.2 g/dL (ref 3.5–5.0)
Alkaline Phosphatase: 96 U/L (ref 38–126)
Anion gap: 9 (ref 5–15)
BUN: 10 mg/dL (ref 6–20)
CO2: 28 mmol/L (ref 22–32)
Calcium: 9.7 mg/dL (ref 8.9–10.3)
Chloride: 100 mmol/L (ref 98–111)
Creatinine: 0.77 mg/dL (ref 0.44–1.00)
GFR, Estimated: >60 mL/min (ref >60)
Glucose, Bld: 111 mg/dL — ABNORMAL HIGH (ref 70–99)
Potassium: 3.7 mmol/L (ref 3.5–5.1)
Sodium: 137 mmol/L (ref 135–145)
Total Bilirubin: 0.4 mg/dL (ref 0.3–1.2)
Total Protein: 7.4 g/dL (ref 6.5–8.1)

## 2020-07-26 NOTE — Progress Notes (Signed)
Hematology and Oncology Follow Up Visit  Amanda Mcconnell [id number] [birth year] 48 y.o. 07/26/2020   Principle Diagnosis:   Splenic Infarcts -- idiopathic  Hepatic thrombus  Iron def anemia -- malabsorption  Sclerosing mesenteritis  Current Therapy:    Eliquis 5 mg po BID  IV Iron -- Feraheme - dose given on 07/2020     Interim History:  Amanda Mcconnell is back for second office visit.  We first saw her back in early October.  We did a hypercoagulable work-up on her.  So far, everything has come back unremarkable.  I am not sure as to why she developed these splenic infarcts.  It is certainly possible this may be somehow related to her being on methotrexate and Plaquenil.  She does have some discoid lupus.  We did find that she does have iron deficiency anemia.  She does have some fatigue.  She is still having some pain over on the left upper abdomen.  She has had some fevers.  She has had no obvious bleeding.  There is no nausea or vomiting.  She has lost weight.  I cannot find any obvious malignancy at this point.  She had her last mammogram back in June.  Everything looked fine on the mammogram.  I really think that an MRI of the abdomen would not be a bad idea to see if we can further look at the splenic infarcts and see if there truly is a thrombus in the liver.  She has had no rashes.  Overall, I would say performance status by ECOG 1.  Medications:  Current Outpatient Medications:  .  methotrexate (RHEUMATREX) 2.5 MG tablet, Take 2.5 mg by mouth once a week. Caution:Chemotherapy. Protect from light. Take six tablets total of 15 mg by mouth once a week., Disp: , Rfl:  .  albuterol (PROVENTIL) (2.5 MG/3ML) 0.083% nebulizer solution, Take 2.5 mg by nebulization every 6 (six) hours as needed. (Patient not taking: Reported on 06/25/2020), Disp: , Rfl:  .  amphetamine-dextroamphetamine (ADDERALL) 30 MG tablet, Take by mouth daily., Disp: , Rfl:  .  APIXABAN (ELIQUIS) VTE  STARTER PACK (10MG  AND 5MG ), Take 2 tablets (10mg ) by mouth twice daily for 7 days, then take 1 tablet (5 mg) twice daily., Disp: , Rfl:  .  citalopram (CELEXA) 20 MG tablet, Take 40 mg by mouth daily., Disp: , Rfl:  .  ferrous sulfate 325 (65 FE) MG EC tablet, Take 325 mg by mouth in the morning and at bedtime., Disp: , Rfl:  .  folic acid (FOLVITE) 1 MG tablet, Take by mouth., Disp: , Rfl:  .  hydrocortisone 2.5 % cream, Apply to rash twice a day as needed, Disp: , Rfl:  .  hydroxychloroquine (PLAQUENIL) 200 MG tablet, Take 200 mg by mouth daily., Disp: , Rfl:  .  omeprazole (PRILOSEC) 20 MG capsule, Take 1 capsule (20 mg total) by mouth daily. Take 30 minutes before breakfast. (Patient not taking: Reported on 06/25/2020), Disp: 90 capsule, Rfl: 4 .  ondansetron (ZOFRAN-ODT) 4 MG disintegrating tablet, Take 4 mg by mouth every 6 (six) hours as needed. (Patient not taking: Reported on 06/25/2020), Disp: , Rfl:  .  oxyCODONE-acetaminophen (PERCOCET/ROXICET) 5-325 MG tablet, Take 1 tablet by mouth every 4 (four) hours., Disp: , Rfl:  .  predniSONE (DELTASONE) 5 MG tablet, Take 5 mg by mouth daily with breakfast. , Disp: , Rfl:  .  traZODone (DESYREL) 100 MG tablet, Take 100 mg by mouth at bedtime., Disp: , Rfl:  Allergies:  Allergies  Allergen Reactions  . Doxycycline Swelling  . Penicillins Hives  . Penicillin G Rash    Past Medical History, Surgical history, Social history, and Family History were reviewed and updated.  Review of Systems: Review of Systems  Constitutional: Positive for diaphoresis, fever and unexpected weight change.  HENT:  Negative.   Eyes: Negative.   Respiratory: Negative.   Cardiovascular: Negative.   Gastrointestinal: Positive for abdominal pain.  Endocrine: Negative.   Musculoskeletal: Positive for flank pain and myalgias. Negative for neck pain.  Skin: Negative.   Neurological: Negative.   Hematological: Negative.   Psychiatric/Behavioral: Negative.      Physical Exam:  weight is 127 lb (57.6 kg). Her oral temperature is 98.2 F (36.8 C). Her blood pressure is 101/62 and her pulse is 87. Her respiration is 16 and oxygen saturation is 99%.   Wt Readings from Last 3 Encounters:  07/26/20 127 lb (57.6 kg)  06/25/20 127 lb (57.6 kg)  02/10/20 143 lb (64.9 kg)    Physical Exam Vitals reviewed.  HENT:     Head: Normocephalic and atraumatic.  Eyes:     Pupils: Pupils are equal, round, and reactive to light.  Cardiovascular:     Rate and Rhythm: Normal rate and regular rhythm.     Heart sounds: Normal heart sounds.  Pulmonary:     Effort: Pulmonary effort is normal.     Breath sounds: Normal breath sounds.  Abdominal:     General: Bowel sounds are normal.     Palpations: Abdomen is soft.  Musculoskeletal:        General: No tenderness or deformity. Normal range of motion.     Cervical back: Normal range of motion.  Lymphadenopathy:     Cervical: No cervical adenopathy.  Skin:    General: Skin is warm and dry.     Findings: No erythema or rash.  Neurological:     Mental Status: She is alert and oriented to person, place, and time.  Psychiatric:        Behavior: Behavior normal.        Thought Content: Thought content normal.        Judgment: Judgment normal.    Lab Results  Component Value Date   WBC 9.5 07/26/2020   HGB 10.7 (L) 07/26/2020   HCT 34.1 (L) 07/26/2020   MCV 81.0 07/26/2020   PLT 357 07/26/2020     Chemistry      Component Value Date/Time   NA 137 07/26/2020 1403   K 3.7 07/26/2020 1403   CL 100 07/26/2020 1403   CO2 28 07/26/2020 1403   BUN 10 07/26/2020 1403   CREATININE 0.77 07/26/2020 1403      Component Value Date/Time   CALCIUM 9.7 07/26/2020 1403   ALKPHOS 96 07/26/2020 1403   AST 14 (L) 07/26/2020 1403   ALT 10 07/26/2020 1403   BILITOT 0.4 07/26/2020 1403      Impression and Plan: Amanda Mcconnell is a very nice 49 year old white female.  She has splenic infarct.  Again I cannot find  anything that is however he is hypercoagulable with her.  It certainly seems as if something might be going on with her.  I think were going to have to do the MRI of the abdomen.  Maybe, this will help [place] out.  I we will give her IV iron.  She definitely will benefit from this.  Maybe the sweats and fever that she is having could be  from the splenic infarct.  I will know if it might be from her medications.  We we will have to follow her closely.  I would like to get her back to see [place] in about 3 weeks or so.  This is complicated.  There is no obvious etiology for what is going on with her.  We will certainly have to be aggressive.  Josph Macho, MD 11/4/20215:22 PM

## 2020-07-27 LAB — CEA (IN HOUSE-CHCC): CEA (CHCC-In House): 4.63 ng/mL (ref 0.00–5.00)

## 2020-07-27 LAB — CA 125: Cancer Antigen (CA) 125: 26.8 U/mL (ref 0.0–38.1)

## 2020-07-28 ENCOUNTER — Ambulatory Visit (HOSPITAL_BASED_OUTPATIENT_CLINIC_OR_DEPARTMENT_OTHER)
Admission: RE | Admit: 2020-07-28 | Discharge: 2020-07-28 | Disposition: A | Discharge status: Home / Self Care | Source: Ambulatory Visit | Attending: Hematology & Oncology | Admitting: Hematology & Oncology

## 2020-07-28 ENCOUNTER — Other Ambulatory Visit: Payer: Self-pay

## 2020-07-28 DIAGNOSIS — D500 Iron deficiency anemia secondary to blood loss (chronic): Secondary | ICD-10-CM

## 2020-07-28 DIAGNOSIS — K654 Sclerosing mesenteritis: Secondary | ICD-10-CM

## 2020-08-04 ENCOUNTER — Other Ambulatory Visit: Payer: Self-pay

## 2020-08-04 ENCOUNTER — Ambulatory Visit (HOSPITAL_BASED_OUTPATIENT_CLINIC_OR_DEPARTMENT_OTHER)
Admission: RE | Admit: 2020-08-04 | Discharge: 2020-08-04 | Disposition: A | Discharge status: Home / Self Care | Source: Ambulatory Visit | Attending: Hematology & Oncology | Admitting: Hematology & Oncology

## 2020-08-04 DIAGNOSIS — D500 Iron deficiency anemia secondary to blood loss (chronic): Secondary | ICD-10-CM | POA: Diagnosis present

## 2020-08-04 DIAGNOSIS — K654 Sclerosing mesenteritis: Secondary | ICD-10-CM | POA: Insufficient documentation

## 2020-08-04 IMAGING — MR MR ABDOMEN WO/W CM
9 of 17 series · 25 of 48 positions shown · IV contrast (gadavist)
Comparison: [DATE]

CLINICAL DATA: Follow-up indeterminate splenic and hepatic lesions.

EXAM:
MRI ABDOMEN WITHOUT AND WITH CONTRAST
TECHNIQUE: Multiplanar multisequence MR imaging of the abdomen was performed
both before and after the administration of intravenous contrast.
CONTRAST:  5.7mL GADAVIST GADOBUTROL 1 MMOL/ML IV SOLN

[Series 4: T2 · coronal · 7.0mm · 1.25mm/px · 1 of 30 slices shown (1 of 2)]
[im 1/30]
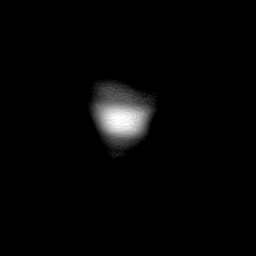

[Series 5: T2 fat-sat · axial · 6.0mm · 1.29mm/px · z∈[-186,+61]mm · 2 of 34 slices shown]
[im 1/34]
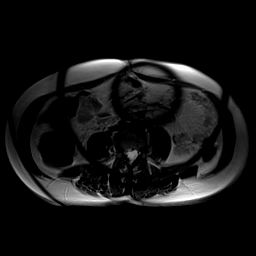
[im 34/34]
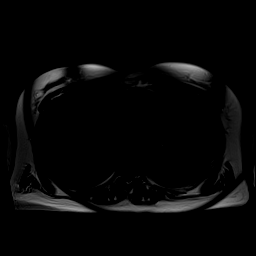

[Series 6: [id number]_[id number] free breathing · axial · 6.0mm · 1.72mm/px · z∈[-186,+61]mm · 6 of 102 slices shown]
[im 1/102]
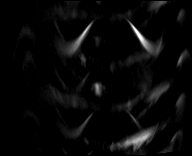
[im 21/102]
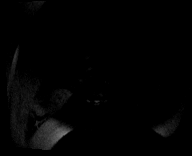
[im 41/102]
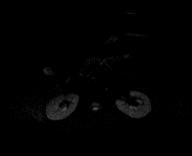
[im 61/102]
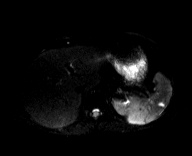
[im 81/102]
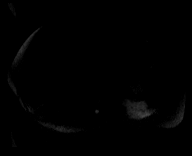
[im 102/102]
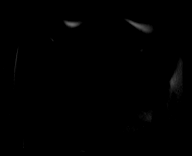

[Series 7: [id number] free breathing_adc · axial · 6.0mm · 1.72mm/px · z∈[-186,+61]mm · 2 of 34 slices shown]
[im 1/34]
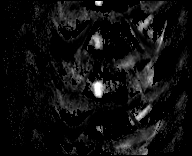
[im 34/34]
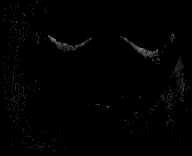

[Series 8: in + out · axial · 6.0mm · 0.64mm/px · z∈[-222,+10]mm · 4 of 64 slices shown]
[im 1/64]
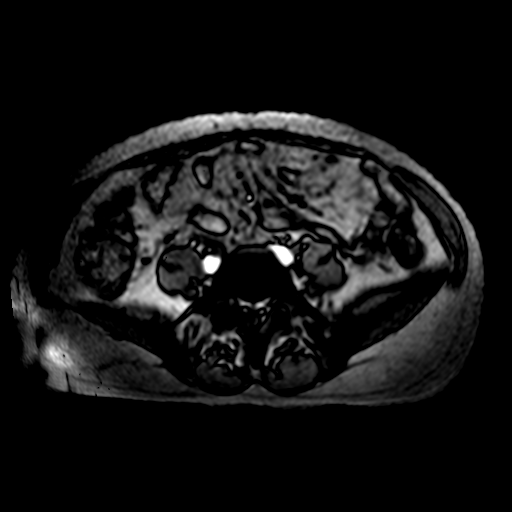
[im 22/64]
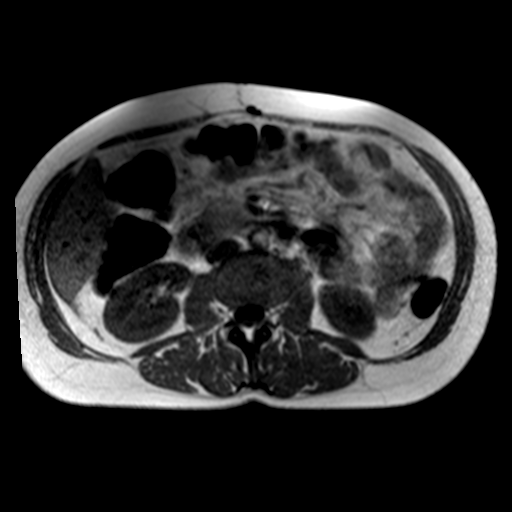
[im 43/64]
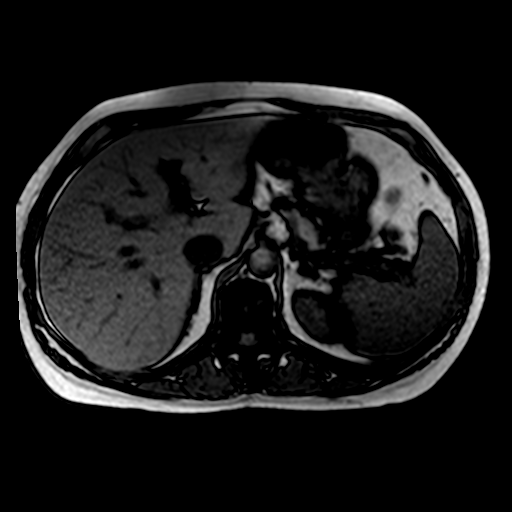
[im 64/64]
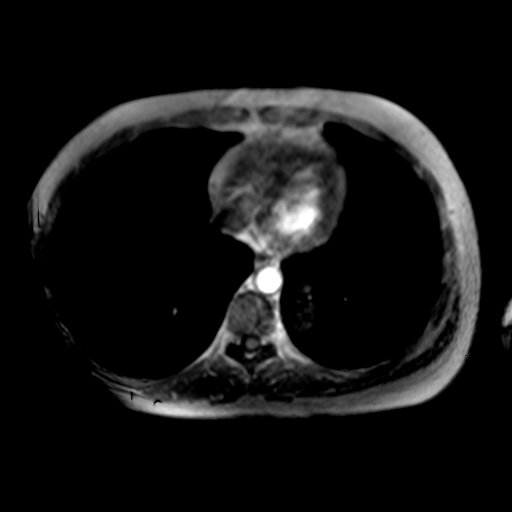

[Series 9: DWI · axial · 6.0mm · 0.64mm/px · z∈[-222,+10]mm · 2 of 32 slices shown]
[im 1/32]
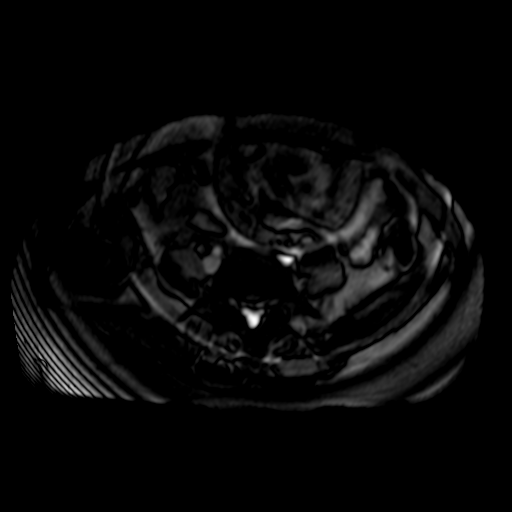
[im 32/32]
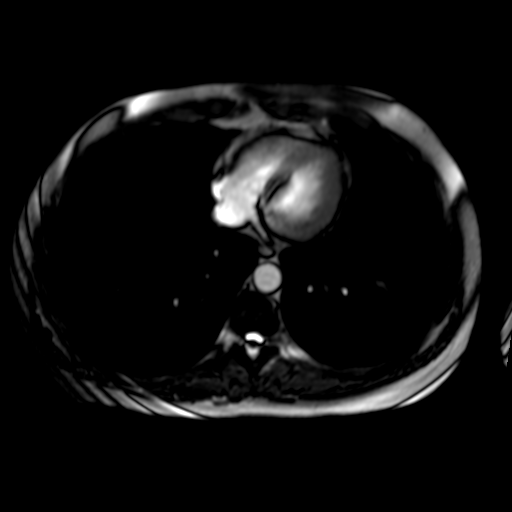

[Series 18: T2 · axial · 6.0mm · 1.29mm/px · z∈[-222,+10]mm · 2 of 32 slices shown (2 of 2)]
[im 1/32]
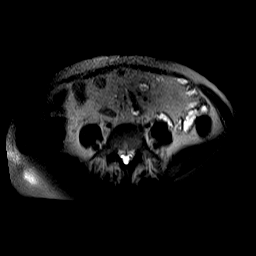
[im 32/32]
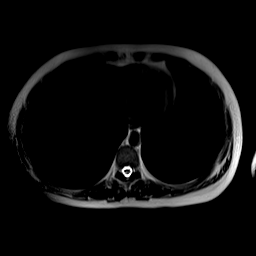

[Series 19: T1 dynamic fat-sat post-contrast · axial · delayed · 5.0mm · 1.18mm/px · z∈[-218,+17]mm · 3 of 48 slices shown (1 of 2)]
[im 1/48]
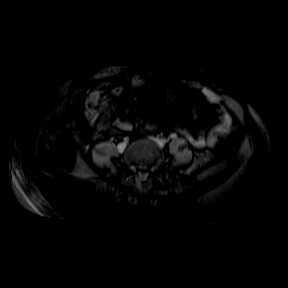
[im 24/48]
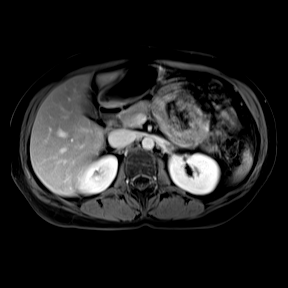
[im 48/48]
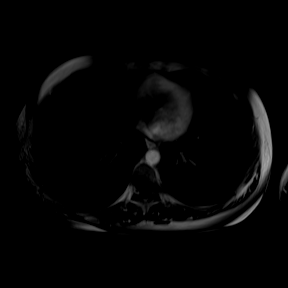

[Series 20: T1 dynamic fat-sat post-contrast · axial · 5.0mm · 1.18mm/px · z∈[-218,+17]mm · 3 of 48 slices shown (2 of 2)]
[im 1/48]
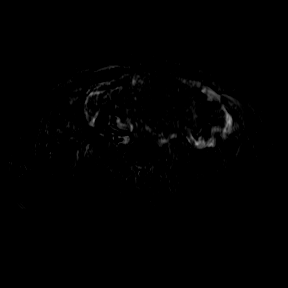
[im 24/48]
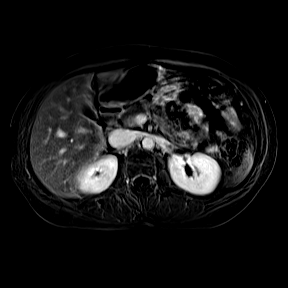
[im 48/48]
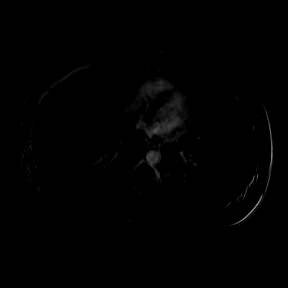

[25 of 48 positions shown; findings below may reference images not displayed]

FINDINGS: Lower chest: No acute findings.

Hepatobiliary: An ill-defined triangular area of arterial phase
hyperenhancement is again seen in the lateral dome of the right
hepatic lobe shows, without significant change since prior study. No
abnormality is seen on any other sequences including diffusion
imaging two views is, and this remains consistent with a perfusion
anomaly. No other hepatic lesions are identified. Gallbladder is
unremarkable. No evidence of biliary ductal dilatation.

Pancreas:  No mass or inflammatory changes.

Spleen: No evidence of splenomegaly. A complex cystic lesion in the
superior aspect of the spleen has decreased in size, currently
measuring 3.1 x 2.3 cm on image 13/15, compared to the 4.3 x 4.1 cm
previously. The previously seen subcapsular lesion in the anterior
aspect of the spleen has also decreased in size, currently measuring
1.7 cm on image 13/15, compared to 2.4 cm previously. The previously
seen small subcapsular lesion near the splenic hilum is no longer
visualized. No or enlarging splenic lesions are identified.

Adrenals/Urinary Tract: No masses identified. No evidence of
hydronephrosis.

Stomach/Bowel: Visualized portion unremarkable.

Vascular/Lymphatic: No pathologically enlarged lymph nodes
identified. No abdominal aortic aneurysm.

Other:  None.

Musculoskeletal:  No suspicious bone lesions identified.
IMPRESSION: Further decreased size of splenic lesions since prior study,
consistent with benign etiology. These favor resolving infarcts over
infection.

Stable perfusion anomaly in the lateral dome of the right hepatic
lobe. No evidence of hepatic neoplasm.

## 2020-08-04 MED ORDER — GADOBUTROL 1 MMOL/ML IV SOLN
5.7000 mL | Freq: Once | INTRAVENOUS | Status: AC | PRN
  Administered 2020-08-04: 5.7 mL via INTRAVENOUS

## 2020-08-06 ENCOUNTER — Telehealth: Payer: Self-pay | Admitting: *Deleted

## 2020-08-06 NOTE — Telephone Encounter (Signed)
-----   Message from Josph Macho, MD sent at 08/06/2020  9:31 AM EST ----- Call  - the MRI looks great!!!  The splenic lesions are decreasing!!!  No evidence of cancer!!  Cindee Lame

## 2020-08-06 NOTE — Telephone Encounter (Signed)
Notified pt of results.pt verbalized understanding. Pt questioned need for iron or her next follow up.  Will review with MD for additional information. And reach out to pt with update.

## 2020-09-03 ENCOUNTER — Encounter: Payer: Self-pay | Admitting: Hematology & Oncology

## 2020-09-03 ENCOUNTER — Inpatient Hospital Stay: Attending: Hematology & Oncology

## 2020-09-03 ENCOUNTER — Telehealth: Payer: Self-pay | Admitting: Hematology & Oncology

## 2020-09-03 ENCOUNTER — Inpatient Hospital Stay (HOSPITAL_BASED_OUTPATIENT_CLINIC_OR_DEPARTMENT_OTHER): Admitting: Hematology & Oncology

## 2020-09-03 ENCOUNTER — Other Ambulatory Visit: Payer: Self-pay

## 2020-09-03 ENCOUNTER — Telehealth: Payer: Self-pay | Admitting: *Deleted

## 2020-09-03 VITALS — BP 106/69 | HR 86 | Temp 98.2°F | Resp 18 | Wt 132.8 lb

## 2020-09-03 DIAGNOSIS — Z79899 Other long term (current) drug therapy: Secondary | ICD-10-CM | POA: Diagnosis not present

## 2020-09-03 DIAGNOSIS — D500 Iron deficiency anemia secondary to blood loss (chronic): Secondary | ICD-10-CM

## 2020-09-03 DIAGNOSIS — D735 Infarction of spleen: Secondary | ICD-10-CM | POA: Diagnosis present

## 2020-09-03 DIAGNOSIS — D509 Iron deficiency anemia, unspecified: Secondary | ICD-10-CM | POA: Insufficient documentation

## 2020-09-03 LAB — CMP (CANCER CENTER ONLY)
ALT: 19 U/L (ref 0–44)
AST: 19 U/L (ref 15–41)
Albumin: 4 g/dL (ref 3.5–5.0)
Alkaline Phosphatase: 86 U/L (ref 38–126)
Anion gap: 8 (ref 5–15)
BUN: 7 mg/dL (ref 6–20)
CO2: 27 mmol/L (ref 22–32)
Calcium: 9.2 mg/dL (ref 8.9–10.3)
Chloride: 105 mmol/L (ref 98–111)
Creatinine: 0.8 mg/dL (ref 0.44–1.00)
GFR, Estimated: >60 mL/min (ref >60)
Glucose, Bld: 79 mg/dL (ref 70–99)
Potassium: 3.4 mmol/L — ABNORMAL LOW (ref 3.5–5.1)
Sodium: 140 mmol/L (ref 135–145)
Total Bilirubin: 0.2 mg/dL — ABNORMAL LOW (ref 0.3–1.2)
Total Protein: 6.1 g/dL — ABNORMAL LOW (ref 6.5–8.1)

## 2020-09-03 LAB — CBC WITH DIFFERENTIAL (CANCER CENTER ONLY)
Abs Immature Granulocytes: 0.05 10*3/uL (ref 0.00–0.07)
Basophils Absolute: 0.1 10*3/uL (ref 0.0–0.1)
Basophils Relative: 1 %
Eosinophils Absolute: 0.2 10*3/uL (ref 0.0–0.5)
Eosinophils Relative: 2 %
HCT: 36.5 % (ref 36.0–46.0)
Hemoglobin: 11.8 g/dL — ABNORMAL LOW (ref 12.0–15.0)
Immature Granulocytes: 1 %
Lymphocytes Relative: 15 %
Lymphs Abs: 1.3 10*3/uL (ref 0.7–4.0)
MCH: 28 pg (ref 26.0–34.0)
MCHC: 32.3 g/dL (ref 30.0–36.0)
MCV: 86.7 fL (ref 80.0–100.0)
Monocytes Absolute: 0.6 10*3/uL (ref 0.1–1.0)
Monocytes Relative: 7 %
Neutro Abs: 6.3 10*3/uL (ref 1.7–7.7)
Neutrophils Relative %: 74 %
Platelet Count: 304 10*3/uL (ref 150–400)
RBC: 4.21 MIL/uL (ref 3.87–5.11)
RDW: 18.5 % — ABNORMAL HIGH (ref 11.5–15.5)
WBC Count: 8.5 10*3/uL (ref 4.0–10.5)
nRBC: 0 % (ref 0.0–0.2)

## 2020-09-03 LAB — FERRITIN: Ferritin: 90 ng/mL (ref 11–307)

## 2020-09-03 LAB — IRON AND TIBC
Iron: 46 ug/dL (ref 41–142)
Saturation Ratios: 17 % — ABNORMAL LOW (ref 21–57)
TIBC: 271 ug/dL (ref 236–444)
UIBC: 226 ug/dL (ref 120–384)

## 2020-09-03 NOTE — Progress Notes (Signed)
Hematology and Oncology Follow Up Visit  Amanda Mcconnell [id number] 1971/01/07 49 y.o. 09/03/2020   Principle Diagnosis:   Splenic Infarcts -- idiopathic  Hepatic thrombus  Iron def anemia -- malabsorption  Sclerosing mesenteritis  Current Therapy:    Eliquis 5 mg po BID -- d/c  IV Iron -- Feraheme - dose given on 07/2020     Interim History:  Amanda Mcconnell is back for follow-up.  She has been doing a little bit better.  We did find out that her iron studies were on the low side.  Iron saturation was only 6%.  I had her set up with IV iron but apparently, this was not scheduled.  I am sure that I did not click there are certain button.  She is still having some abdominal pain.  We did do a MRI of the belly.  The MRI thankfully showed decreased lesions in her spleen and liver.  It appears that these infarcts are resolving..  She is off the Eliquis.  I certainly have no problems with this.  She is still taking the methotrexate and Plaquenil.  This might be causing some of the abdominal pain.  I told her to stop the oral iron.  I wonder if the oral iron may not be causing some issues with the abdominal pain.  She is little constipated.  I told her to try some MiraLAX.  Thankfully, we have not found any evidence of malignancy.  She has the sclerosing mesenteritis.  This is why she is on the methotrexate.  Hopefully this is helping.  She did have a nice Thanksgiving.  She is able to eat pretty well.  Overall, her performance status right now is ECOG 0.    Medications:  Current Outpatient Medications:  .  amphetamine-dextroamphetamine (ADDERALL) 30 MG tablet, Take by mouth daily., Disp: , Rfl:  .  APIXABAN (ELIQUIS) VTE STARTER PACK (10MG  AND 5MG ), Take 2 tablets (10mg ) by mouth twice daily for 7 days, then take 1 tablet (5 mg) twice daily., Disp: , Rfl:  .  citalopram (CELEXA) 20 MG tablet, Take 40 mg by mouth daily., Disp: , Rfl:  .  ferrous sulfate 325 (65 FE) MG EC tablet, Take  325 mg by mouth in the morning and at bedtime., Disp: , Rfl:  .  folic acid (FOLVITE) 1 MG tablet, Take by mouth., Disp: , Rfl:  .  hydrocortisone 2.5 % cream, Apply to rash twice a day as needed, Disp: , Rfl:  .  hydroxychloroquine (PLAQUENIL) 200 MG tablet, Take 200 mg by mouth daily., Disp: , Rfl:  .  methotrexate (RHEUMATREX) 2.5 MG tablet, Take 2.5 mg by mouth once a week. Caution:Chemotherapy. Protect from light. Take six tablets total of 15 mg by mouth once a week., Disp: , Rfl:  .  oxyCODONE-acetaminophen (PERCOCET/ROXICET) 5-325 MG tablet, Take 1 tablet by mouth every 4 (four) hours., Disp: , Rfl:  .  predniSONE (DELTASONE) 5 MG tablet, Take 5 mg by mouth daily with breakfast. , Disp: , Rfl:  .  traZODone (DESYREL) 100 MG tablet, Take 100 mg by mouth at bedtime., Disp: , Rfl:  .  albuterol (PROVENTIL) (2.5 MG/3ML) 0.083% nebulizer solution, Take 2.5 mg by nebulization every 6 (six) hours as needed. (Patient not taking: No sig reported), Disp: , Rfl:  .  omeprazole (PRILOSEC) 20 MG capsule, Take 1 capsule (20 mg total) by mouth daily. Take 30 minutes before breakfast. (Patient not taking: No sig reported), Disp: 90 capsule, Rfl: 4 .  ondansetron (ZOFRAN-ODT) 4 MG disintegrating tablet, Take 4 mg by mouth every 6 (six) hours as needed. (Patient not taking: No sig reported), Disp: , Rfl:   Allergies:  Allergies  Allergen Reactions  . Doxycycline Swelling  . Penicillins Hives  . Penicillin G Rash    Past Medical History, Surgical history, Social history, and Family History were reviewed and updated.  Review of Systems: Review of Systems  Constitutional: Positive for diaphoresis, fever and unexpected weight change.  HENT:  Negative.   Eyes: Negative.   Respiratory: Negative.   Cardiovascular: Negative.   Gastrointestinal: Positive for abdominal pain.  Endocrine: Negative.   Musculoskeletal: Positive for flank pain and myalgias. Negative for neck pain.  Skin: Negative.    Neurological: Negative.   Hematological: Negative.   Psychiatric/Behavioral: Negative.     Physical Exam:  weight is 132 lb 12 oz (60.2 kg). Her oral temperature is 98.2 F (36.8 C). Her blood pressure is 106/69 and her pulse is 86. Her respiration is 18 and oxygen saturation is 98%.   Wt Readings from Last 3 Encounters:  09/03/20 132 lb 12 oz (60.2 kg)  07/26/20 127 lb (57.6 kg)  06/25/20 127 lb (57.6 kg)    Physical Exam Vitals reviewed.  HENT:     Head: Normocephalic and atraumatic.  Eyes:     Pupils: Pupils are equal, round, and reactive to light.  Cardiovascular:     Rate and Rhythm: Normal rate and regular rhythm.     Heart sounds: Normal heart sounds.  Pulmonary:     Effort: Pulmonary effort is normal.     Breath sounds: Normal breath sounds.  Abdominal:     General: Bowel sounds are normal.     Palpations: Abdomen is soft.  Musculoskeletal:        General: No tenderness or deformity. Normal range of motion.     Cervical back: Normal range of motion.  Lymphadenopathy:     Cervical: No cervical adenopathy.  Skin:    General: Skin is warm and dry.     Findings: No erythema or rash.  Neurological:     Mental Status: She is alert and oriented to person, place, and time.  Psychiatric:        Behavior: Behavior normal.        Thought Content: Thought content normal.        Judgment: Judgment normal.    Lab Results  Component Value Date   WBC 8.5 09/03/2020   HGB 11.8 (L) 09/03/2020   HCT 36.5 09/03/2020   MCV 86.7 09/03/2020   PLT 304 09/03/2020     Chemistry      Component Value Date/Time   NA 140 09/03/2020 0909   K 3.4 (L) 09/03/2020 0909   CL 105 09/03/2020 0909   CO2 27 09/03/2020 0909   BUN 7 09/03/2020 0909   CREATININE 0.80 09/03/2020 0909      Component Value Date/Time   CALCIUM 9.2 09/03/2020 0909   ALKPHOS 86 09/03/2020 0909   AST 19 09/03/2020 0909   ALT 19 09/03/2020 0909   BILITOT 0.2 (L) 09/03/2020 0909      Impression and  Plan: Amanda Mcconnell is a very nice 49 year old white female.  She has splenic infarcts.  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  She is now off anticoagulation.  Hopefully she will continue to do well off anticoagulation.  We will see what her iron studies look like.  If she needs iron,  we will give her IV iron.  She will stop the oral iron.  I am just glad that her blood counts are better.  She seems to be responding to the oral iron although it might be causing some of her abdominal issues.  We will go ahead and plan to get her back in 6 weeks now.  I am just glad that she feels better and that her quality of life seems to be doing better.  She certainly is quite spunky and is very motivated.    Josph Macho, MD 12/13/202110:41 AM

## 2020-09-03 NOTE — Telephone Encounter (Signed)
As noted below by Dr. Myna Hidalgo, I informed the patient that her iron level is still low. He would like for her to get IV Feraheme. She verbalized understanding. LOS sent to scheduling.

## 2020-09-03 NOTE — Telephone Encounter (Signed)
-----   Message from Josph Macho, MD sent at 09/03/2020  2:55 PM EST ----- Call - the iron is still low.  Please get her in for IV Feraheme!!!  Cindee Lame

## 2020-09-03 NOTE — Telephone Encounter (Signed)
Per 12/13 los appointments scheduled

## 2020-09-04 ENCOUNTER — Telehealth: Payer: Self-pay

## 2020-09-04 NOTE — Telephone Encounter (Signed)
S/w pt per 09/04/20 inbasket and she is aware of her iron tx appt....Marland KitchenAOM

## 2020-09-06 ENCOUNTER — Ambulatory Visit

## 2020-09-06 LAB — PNH PROFILE (-HIGH SENSITIVITY)

## 2020-09-18 ENCOUNTER — Other Ambulatory Visit: Payer: Self-pay | Admitting: Hematology & Oncology

## 2020-09-18 LAB — JAK2 (INCLUDING V617F AND EXON 12), MPL,& CALR-NEXT GEN SEQ

## 2020-09-20 ENCOUNTER — Inpatient Hospital Stay

## 2020-09-20 ENCOUNTER — Other Ambulatory Visit: Payer: Self-pay

## 2020-09-20 VITALS — BP 103/66 | HR 79 | Temp 97.9°F | Resp 16

## 2020-09-20 DIAGNOSIS — D735 Infarction of spleen: Secondary | ICD-10-CM | POA: Diagnosis not present

## 2020-09-20 DIAGNOSIS — K909 Intestinal malabsorption, unspecified: Secondary | ICD-10-CM

## 2020-09-20 DIAGNOSIS — D500 Iron deficiency anemia secondary to blood loss (chronic): Secondary | ICD-10-CM

## 2020-09-20 MED ORDER — SODIUM CHLORIDE 0.9 % IV SOLN
510.0000 mg | Freq: Once | INTRAVENOUS | Status: AC
  Administered 2020-09-20: 510 mg via INTRAVENOUS
  Filled 2020-09-20: qty 510

## 2020-09-20 MED ORDER — SODIUM CHLORIDE 0.9 % IV SOLN
Freq: Once | INTRAVENOUS | Status: AC
  Filled 2020-09-20: qty 250

## 2020-09-20 NOTE — Patient Instructions (Signed)

## 2020-09-27 ENCOUNTER — Ambulatory Visit

## 2020-10-05 ENCOUNTER — Other Ambulatory Visit: Payer: Self-pay

## 2020-10-05 ENCOUNTER — Inpatient Hospital Stay: Attending: Hematology & Oncology

## 2020-10-05 VITALS — BP 102/56 | HR 100 | Temp 98.1°F | Resp 16

## 2020-10-05 DIAGNOSIS — K909 Intestinal malabsorption, unspecified: Secondary | ICD-10-CM | POA: Insufficient documentation

## 2020-10-05 DIAGNOSIS — Z79899 Other long term (current) drug therapy: Secondary | ICD-10-CM | POA: Insufficient documentation

## 2020-10-05 DIAGNOSIS — R109 Unspecified abdominal pain: Secondary | ICD-10-CM | POA: Insufficient documentation

## 2020-10-05 DIAGNOSIS — D735 Infarction of spleen: Secondary | ICD-10-CM | POA: Diagnosis not present

## 2020-10-05 DIAGNOSIS — K654 Sclerosing mesenteritis: Secondary | ICD-10-CM | POA: Insufficient documentation

## 2020-10-05 DIAGNOSIS — R509 Fever, unspecified: Secondary | ICD-10-CM | POA: Diagnosis not present

## 2020-10-05 DIAGNOSIS — Z7901 Long term (current) use of anticoagulants: Secondary | ICD-10-CM | POA: Insufficient documentation

## 2020-10-05 DIAGNOSIS — Z881 Allergy status to other antibiotic agents status: Secondary | ICD-10-CM | POA: Insufficient documentation

## 2020-10-05 DIAGNOSIS — D508 Other iron deficiency anemias: Secondary | ICD-10-CM | POA: Insufficient documentation

## 2020-10-05 DIAGNOSIS — R61 Generalized hyperhidrosis: Secondary | ICD-10-CM | POA: Insufficient documentation

## 2020-10-05 DIAGNOSIS — D500 Iron deficiency anemia secondary to blood loss (chronic): Secondary | ICD-10-CM

## 2020-10-05 DIAGNOSIS — M791 Myalgia, unspecified site: Secondary | ICD-10-CM | POA: Diagnosis not present

## 2020-10-05 DIAGNOSIS — I820 Budd-Chiari syndrome: Secondary | ICD-10-CM | POA: Insufficient documentation

## 2020-10-05 DIAGNOSIS — Z7952 Long term (current) use of systemic steroids: Secondary | ICD-10-CM | POA: Diagnosis not present

## 2020-10-05 DIAGNOSIS — Z880 Allergy status to penicillin: Secondary | ICD-10-CM | POA: Diagnosis not present

## 2020-10-05 MED ORDER — SODIUM CHLORIDE 0.9 % IV SOLN
510.0000 mg | Freq: Once | INTRAVENOUS | Status: AC
  Administered 2020-10-05: 510 mg via INTRAVENOUS
  Filled 2020-10-05: qty 17

## 2020-10-05 MED ORDER — SODIUM CHLORIDE 0.9 % IV SOLN
Freq: Once | INTRAVENOUS | Status: AC
  Filled 2020-10-05: qty 250

## 2020-10-05 NOTE — Patient Instructions (Signed)
Ferumoxytol injection What is this medicine? FERUMOXYTOL is an iron complex. Iron is used to make healthy red blood cells, which carry oxygen and nutrients throughout the body. This medicine is used to treat iron deficiency anemia. This medicine may be used for other purposes; ask your health care provider or pharmacist if you have questions. COMMON BRAND NAME(S): Feraheme What should I tell my health care provider before I take this medicine? They need to know if you have any of these conditions:  anemia not caused by low iron levels  high levels of iron in the blood  magnetic resonance imaging (MRI) test scheduled  an unusual or allergic reaction to iron, other medicines, foods, dyes, or preservatives  pregnant or trying to get pregnant  breast-feeding How should I use this medicine? This medicine is for injection into a vein. It is given by a health care professional in a hospital or clinic setting. Talk to your pediatrician regarding the use of this medicine in children. Special care may be needed. Overdosage: If you think you have taken too much of this medicine contact a poison control center or emergency room at once. NOTE: This medicine is only for you. Do not share this medicine with others. What if I miss a dose? It is important not to miss your dose. Call your doctor or health care professional if you are unable to keep an appointment. What may interact with this medicine? This medicine may interact with the following medications:  other iron products This list may not describe all possible interactions. Give your health care provider a list of all the medicines, herbs, non-prescription drugs, or dietary supplements you use. Also tell them if you smoke, drink alcohol, or use illegal drugs. Some items may interact with your medicine. What should I watch for while using this medicine? Visit your doctor or healthcare professional regularly. Tell your doctor or healthcare  professional if your symptoms do not start to get better or if they get worse. You may need blood work done while you are taking this medicine. You may need to follow a special diet. Talk to your doctor. Foods that contain iron include: whole grains/cereals, dried fruits, beans, or peas, leafy green vegetables, and organ meats (liver, kidney). What side effects may I notice from receiving this medicine? Side effects that you should report to your doctor or health care professional as soon as possible:  allergic reactions like skin rash, itching or hives, swelling of the face, lips, or tongue  breathing problems  changes in blood pressure  feeling faint or lightheaded, falls  fever or chills  flushing, sweating, or hot feelings  swelling of the ankles or feet Side effects that usually do not require medical attention (report to your doctor or health care professional if they continue or are bothersome):  diarrhea  headache  nausea, vomiting  stomach pain This list may not describe all possible side effects. Call your doctor for medical advice about side effects. You may report side effects to FDA at 1-800-FDA-1088. Where should I keep my medicine? This drug is given in a hospital or clinic and will not be stored at home. NOTE: This sheet is a summary. It may not cover all possible information. If you have questions about this medicine, talk to your doctor, pharmacist, or health care provider.  2021 Elsevier/Gold Standard (2016-10-27 20:21:10)  

## 2020-10-15 ENCOUNTER — Inpatient Hospital Stay (HOSPITAL_BASED_OUTPATIENT_CLINIC_OR_DEPARTMENT_OTHER): Admitting: Hematology & Oncology

## 2020-10-15 ENCOUNTER — Encounter: Payer: Self-pay | Admitting: Hematology & Oncology

## 2020-10-15 ENCOUNTER — Other Ambulatory Visit: Payer: Self-pay

## 2020-10-15 ENCOUNTER — Inpatient Hospital Stay

## 2020-10-15 VITALS — BP 113/69 | HR 96 | Temp 98.7°F | Resp 18 | Wt 130.0 lb

## 2020-10-15 DIAGNOSIS — D500 Iron deficiency anemia secondary to blood loss (chronic): Secondary | ICD-10-CM | POA: Diagnosis not present

## 2020-10-15 DIAGNOSIS — D735 Infarction of spleen: Secondary | ICD-10-CM

## 2020-10-15 LAB — CBC WITH DIFFERENTIAL (CANCER CENTER ONLY)
Abs Immature Granulocytes: 0.08 10*3/uL — ABNORMAL HIGH (ref 0.00–0.07)
Basophils Absolute: 0.1 10*3/uL (ref 0.0–0.1)
Basophils Relative: 1 %
Eosinophils Absolute: 0.2 10*3/uL (ref 0.0–0.5)
Eosinophils Relative: 2 %
HCT: 41.6 % (ref 36.0–46.0)
Hemoglobin: 14 g/dL (ref 12.0–15.0)
Immature Granulocytes: 1 %
Lymphocytes Relative: 17 %
Lymphs Abs: 1.7 10*3/uL (ref 0.7–4.0)
MCH: 29.4 pg (ref 26.0–34.0)
MCHC: 33.7 g/dL (ref 30.0–36.0)
MCV: 87.4 fL (ref 80.0–100.0)
Monocytes Absolute: 0.6 10*3/uL (ref 0.1–1.0)
Monocytes Relative: 6 %
Neutro Abs: 7.4 10*3/uL (ref 1.7–7.7)
Neutrophils Relative %: 73 %
Platelet Count: 281 10*3/uL (ref 150–400)
RBC: 4.76 MIL/uL (ref 3.87–5.11)
RDW: 15.5 % (ref 11.5–15.5)
WBC Count: 10 10*3/uL (ref 4.0–10.5)
nRBC: 0 % (ref 0.0–0.2)

## 2020-10-15 LAB — CMP (CANCER CENTER ONLY)
ALT: 25 U/L (ref 0–44)
AST: 16 U/L (ref 15–41)
Albumin: 4.4 g/dL (ref 3.5–5.0)
Alkaline Phosphatase: 101 U/L (ref 38–126)
Anion gap: 8 (ref 5–15)
BUN: 12 mg/dL (ref 6–20)
CO2: 30 mmol/L (ref 22–32)
Calcium: 10 mg/dL (ref 8.9–10.3)
Chloride: 99 mmol/L (ref 98–111)
Creatinine: 0.71 mg/dL (ref 0.44–1.00)
GFR, Estimated: >60 mL/min (ref >60)
Glucose, Bld: 109 mg/dL — ABNORMAL HIGH (ref 70–99)
Potassium: 3.8 mmol/L (ref 3.5–5.1)
Sodium: 137 mmol/L (ref 135–145)
Total Bilirubin: 0.4 mg/dL (ref 0.3–1.2)
Total Protein: 7.2 g/dL (ref 6.5–8.1)

## 2020-10-15 LAB — LACTATE DEHYDROGENASE: LDH: 149 U/L (ref 98–192)

## 2020-10-15 NOTE — Progress Notes (Signed)
Hematology and Oncology Follow Up Visit  Teriah Muela [id number] [birth year] 50 y.o. 10/15/2020   Principle Diagnosis:   Splenic Infarcts -- idiopathic  Hepatic thrombus  Iron def anemia -- malabsorption  Sclerosing mesenteritis  Current Therapy:    Eliquis 5 mg po BID -- d/c  IV Iron -- Feraheme - dose given on 07/2020     Interim History:  Ms. Boan is back for follow-up.  She did have a nice Christmas and New Year's.  She was with her family.  She is able to enjoy them.  She has responded very well to iron.  Gave her iron back in November.  Her hemoglobin went from 11.8 up to 14.  She is on methotrexate for the sclerosing mesenteritis.  This seems to be keeping this under control.  She is on Plaquenil for the lupus.  She does have some lupus lesions on her skin.  She says she gets these.  She has had no obvious change in bowel or bladder habits.  There is no complaints of abdominal pain.  She has had no nausea or vomiting.  There has been no cough or shortness of breath.    She has been off work for the past week or so.  I think she goes back tomorrow.  Overall, I would say her performance status is ECOG 1.  l   Medications:  Current Outpatient Medications:  .  albuterol (PROVENTIL) (2.5 MG/3ML) 0.083% nebulizer solution, Take 2.5 mg by nebulization every 6 (six) hours as needed. (Patient not taking: No sig reported), Disp: , Rfl:  .  amphetamine-dextroamphetamine (ADDERALL) 30 MG tablet, Take by mouth daily., Disp: , Rfl:  .  citalopram (CELEXA) 20 MG tablet, Take 40 mg by mouth daily., Disp: , Rfl:  .  hydrocortisone 2.5 % cream, Apply to rash twice a day as needed, Disp: , Rfl:  .  hydroxychloroquine (PLAQUENIL) 200 MG tablet, Take 200 mg by mouth daily., Disp: , Rfl:  .  methotrexate (RHEUMATREX) 2.5 MG tablet, Take 2.5 mg by mouth once a week. Caution:Chemotherapy. Protect from light. Take six tablets total of 15 mg by mouth once a week., Disp: , Rfl:  .   omeprazole (PRILOSEC) 20 MG capsule, Take 1 capsule (20 mg total) by mouth daily. Take 30 minutes before breakfast. (Patient not taking: No sig reported), Disp: 90 capsule, Rfl: 4 .  ondansetron (ZOFRAN-ODT) 4 MG disintegrating tablet, Take 4 mg by mouth every 6 (six) hours as needed. (Patient not taking: No sig reported), Disp: , Rfl:  .  oxyCODONE-acetaminophen (PERCOCET/ROXICET) 5-325 MG tablet, Take 1 tablet by mouth every 4 (four) hours., Disp: , Rfl:  .  predniSONE (DELTASONE) 5 MG tablet, Take 5 mg by mouth daily with breakfast. , Disp: , Rfl:  .  traZODone (DESYREL) 100 MG tablet, Take 100 mg by mouth at bedtime., Disp: , Rfl:   Allergies:  Allergies  Allergen Reactions  . Doxycycline Swelling  . Penicillins Hives  . Penicillin G Rash    Past Medical History, Surgical history, Social history, and Family History were reviewed and updated.  Review of Systems: Review of Systems  Constitutional: Positive for diaphoresis, fever and unexpected weight change.  HENT:  Negative.   Eyes: Negative.   Respiratory: Negative.   Cardiovascular: Negative.   Gastrointestinal: Positive for abdominal pain.  Endocrine: Negative.   Musculoskeletal: Positive for flank pain and myalgias. Negative for neck pain.  Skin: Negative.   Neurological: Negative.   Hematological: Negative.   Psychiatric/Behavioral:  Negative.     Physical Exam:  weight is 130 lb (59 kg). Her oral temperature is 98.7 F (37.1 C). Her blood pressure is 113/69 and her pulse is 96. Her respiration is 18 and oxygen saturation is 100%.   Wt Readings from Last 3 Encounters:  10/15/20 130 lb (59 kg)  09/03/20 132 lb 12 oz (60.2 kg)  07/26/20 127 lb (57.6 kg)    Physical Exam Vitals reviewed.  HENT:     Head: Normocephalic and atraumatic.  Eyes:     Pupils: Pupils are equal, round, and reactive to light.  Cardiovascular:     Rate and Rhythm: Normal rate and regular rhythm.     Heart sounds: Normal heart sounds.   Pulmonary:     Effort: Pulmonary effort is normal.     Breath sounds: Normal breath sounds.  Abdominal:     General: Bowel sounds are normal.     Palpations: Abdomen is soft.  Musculoskeletal:        General: No tenderness or deformity. Normal range of motion.     Cervical back: Normal range of motion.  Lymphadenopathy:     Cervical: No cervical adenopathy.  Skin:    General: Skin is warm and dry.     Findings: No erythema or rash.  Neurological:     Mental Status: She is alert and oriented to person, place, and time.  Psychiatric:        Behavior: Behavior normal.        Thought Content: Thought content normal.        Judgment: Judgment normal.    Lab Results  Component Value Date   WBC 10.0 10/15/2020   HGB 14.0 10/15/2020   HCT 41.6 10/15/2020   MCV 87.4 10/15/2020   PLT 281 10/15/2020     Chemistry      Component Value Date/Time   NA 137 10/15/2020 1328   K 3.8 10/15/2020 1328   CL 99 10/15/2020 1328   CO2 30 10/15/2020 1328   BUN 12 10/15/2020 1328   CREATININE 0.71 10/15/2020 1328      Component Value Date/Time   CALCIUM 10.0 10/15/2020 1328   ALKPHOS 101 10/15/2020 1328   AST 16 10/15/2020 1328   ALT 25 10/15/2020 1328   BILITOT 0.4 10/15/2020 1328      Impression and Plan: Ms. Krygier is a very nice 50 year old white female.  She has splenic infarcts.  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  She is now off anticoagulation.  Hopefully she will continue to do well off anticoagulation.  Have to believe that her iron studies are can be better.  The MCV is coming up gradually.  Her hemoglobin has come up really well since we first started seeing her.  I do think it would be worthwhile to make sure we get another scan on her.  The MRI seems to work quite well.  I will set this up for late February.  I think if everything continues to improve, then we will likely not have to do any further scans on her.  I am just happy that she is feeling  better and that the iron finally was able to work and get her blood count better.  I will plan to see her back in March.   Josph Macho, MD 1/24/20222:34 PM

## 2020-10-16 ENCOUNTER — Encounter: Payer: Self-pay | Admitting: *Deleted

## 2020-10-16 LAB — IRON AND TIBC
Iron: 80 ug/dL (ref 41–142)
Saturation Ratios: 28 % (ref 21–57)
TIBC: 283 ug/dL (ref 236–444)
UIBC: 202 ug/dL (ref 120–384)

## 2020-10-16 LAB — FERRITIN: Ferritin: 702 ng/mL — ABNORMAL HIGH (ref 11–307)

## 2020-11-16 ENCOUNTER — Other Ambulatory Visit: Payer: Self-pay | Admitting: *Deleted

## 2020-11-16 ENCOUNTER — Inpatient Hospital Stay: Attending: Hematology & Oncology

## 2020-11-16 ENCOUNTER — Other Ambulatory Visit: Payer: Self-pay

## 2020-11-16 DIAGNOSIS — D735 Infarction of spleen: Secondary | ICD-10-CM | POA: Insufficient documentation

## 2020-11-16 DIAGNOSIS — K909 Intestinal malabsorption, unspecified: Secondary | ICD-10-CM

## 2020-11-16 DIAGNOSIS — K654 Sclerosing mesenteritis: Secondary | ICD-10-CM | POA: Insufficient documentation

## 2020-11-16 DIAGNOSIS — D500 Iron deficiency anemia secondary to blood loss (chronic): Secondary | ICD-10-CM

## 2020-11-16 LAB — CBC WITH DIFFERENTIAL (CANCER CENTER ONLY)
Abs Immature Granulocytes: 0.03 10*3/uL (ref 0.00–0.07)
Basophils Absolute: 0.1 10*3/uL (ref 0.0–0.1)
Basophils Relative: 1 %
Eosinophils Absolute: 0.2 10*3/uL (ref 0.0–0.5)
Eosinophils Relative: 3 %
HCT: 39.3 % (ref 36.0–46.0)
Hemoglobin: 13.5 g/dL (ref 12.0–15.0)
Immature Granulocytes: 0 %
Lymphocytes Relative: 19 %
Lymphs Abs: 1.5 10*3/uL (ref 0.7–4.0)
MCH: 30.5 pg (ref 26.0–34.0)
MCHC: 34.4 g/dL (ref 30.0–36.0)
MCV: 88.7 fL (ref 80.0–100.0)
Monocytes Absolute: 0.5 10*3/uL (ref 0.1–1.0)
Monocytes Relative: 6 %
Neutro Abs: 5.5 10*3/uL (ref 1.7–7.7)
Neutrophils Relative %: 71 %
Platelet Count: 214 10*3/uL (ref 150–400)
RBC: 4.43 MIL/uL (ref 3.87–5.11)
RDW: 13.8 % (ref 11.5–15.5)
WBC Count: 7.8 10*3/uL (ref 4.0–10.5)
nRBC: 0 % (ref 0.0–0.2)

## 2020-11-16 LAB — CMP (CANCER CENTER ONLY)
ALT: 18 U/L (ref 0–44)
AST: 17 U/L (ref 15–41)
Albumin: 4.3 g/dL (ref 3.5–5.0)
Alkaline Phosphatase: 88 U/L (ref 38–126)
Anion gap: 6 (ref 5–15)
BUN: 9 mg/dL (ref 6–20)
CO2: 31 mmol/L (ref 22–32)
Calcium: 9.3 mg/dL (ref 8.9–10.3)
Chloride: 103 mmol/L (ref 98–111)
Creatinine: 0.73 mg/dL (ref 0.44–1.00)
GFR, Estimated: >60 mL/min (ref >60)
Glucose, Bld: 109 mg/dL — ABNORMAL HIGH (ref 70–99)
Potassium: 4.1 mmol/L (ref 3.5–5.1)
Sodium: 140 mmol/L (ref 135–145)
Total Bilirubin: 0.6 mg/dL (ref 0.3–1.2)
Total Protein: 6.5 g/dL (ref 6.5–8.1)

## 2020-11-17 ENCOUNTER — Ambulatory Visit (HOSPITAL_BASED_OUTPATIENT_CLINIC_OR_DEPARTMENT_OTHER)
Admission: RE | Admit: 2020-11-17 | Discharge: 2020-11-17 | Disposition: A | Discharge status: Home / Self Care | Source: Ambulatory Visit | Attending: Hematology & Oncology | Admitting: Hematology & Oncology

## 2020-11-17 DIAGNOSIS — D735 Infarction of spleen: Secondary | ICD-10-CM | POA: Insufficient documentation

## 2020-11-17 IMAGING — MR MR ABDOMEN WO/W CM
11 of 19 series · 26 of 48 positions shown · IV contrast (GADOLINIUM)
Comparison: Abdominal MRI [DATE].

CLINICAL DATA: 49-year-old female with history of splenic artery
dissection, splenic infarcts and hepatic vein thrombosis. History of
mesenteritis. Currently asymptomatic.

EXAM:
MRI ABDOMEN WITHOUT AND WITH CONTRAST
TECHNIQUE: Multiplanar multisequence MR imaging of the abdomen was performed
both before and after the administration of intravenous contrast.
CONTRAST:  6mL GADAVIST GADOBUTROL 1 MMOL/ML IV SOLN

[Series 4: T2 · coronal · 6.5mm · 1.41mm/px · 1 of 27 slices shown]
[im 1/27]
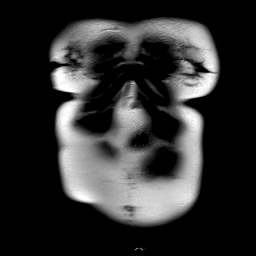

[Series 5: in + out · axial · 6.0mm · 0.72mm/px · z∈[-190,+58]mm · 2 of 68 slices shown]
[im 1/68]
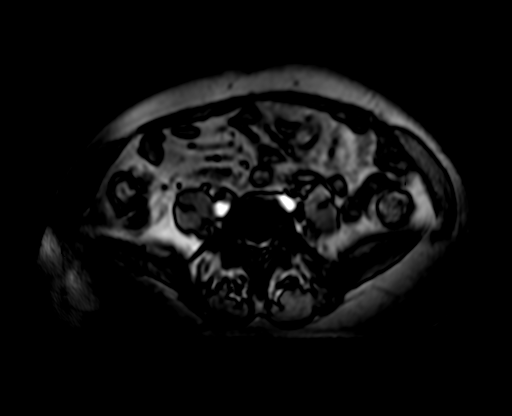
[im 68/68]
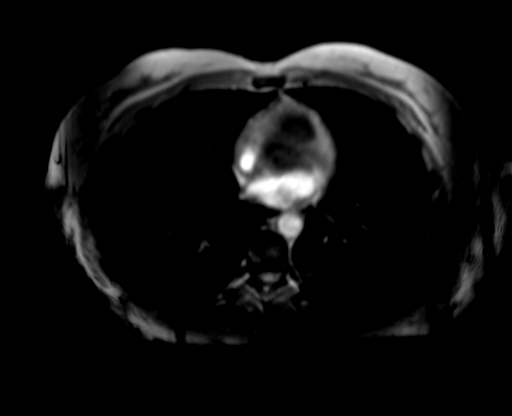

[Series 6: T2 fat-sat · axial · 6.0mm · 1.41mm/px · 1 of 40 slices shown (1 of 2)]
[im 1/40]
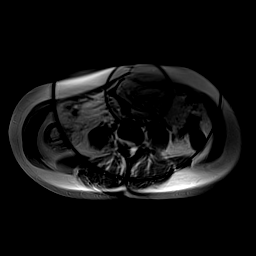

[Series 7: [id number] free breathing · axial · 6.5mm · 1.98mm/px · z∈[-168,+117]mm · 4 of 108 slices shown]
[im 1/108]
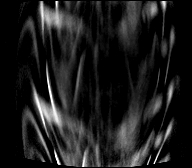
[im 36/108]
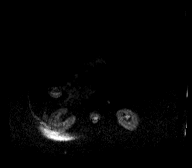
[im 72/108]
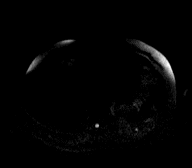
[im 108/108]
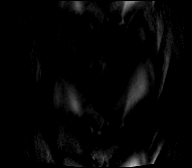

[Series 8: [id number] free breathing_adc · axial · 6.5mm · 1.98mm/px · 1 of 35 slices shown]
[im 1/35]
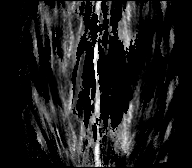

[Series 9: T2 fat-sat · axial · 6.5mm · 1.48mm/px · z∈[-178,+122]mm · 2 of 38 slices shown (2 of 2)]
[im 1/38]
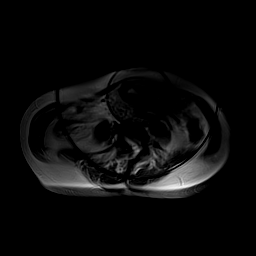
[im 38/38]
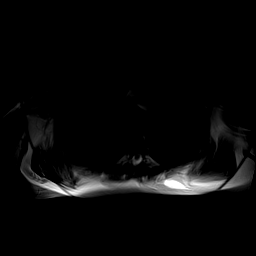

[Series 10: T1 dynamic · axial · non-contrast · 4.4mm · 0.74mm/px · z∈[-170,+72]mm · 3 of 56 slices shown (1 of 5)]
[im 1/56]
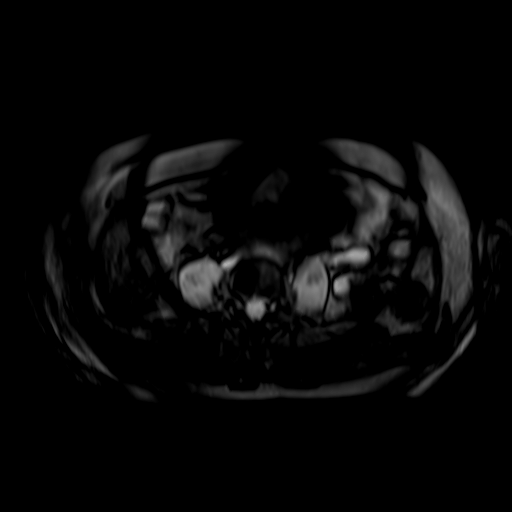
[im 28/56]
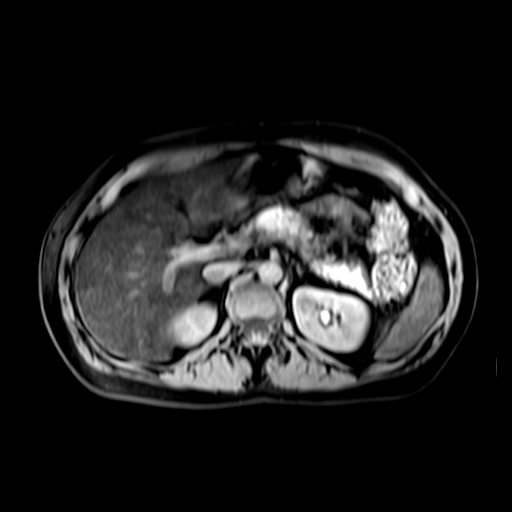
[im 56/56]
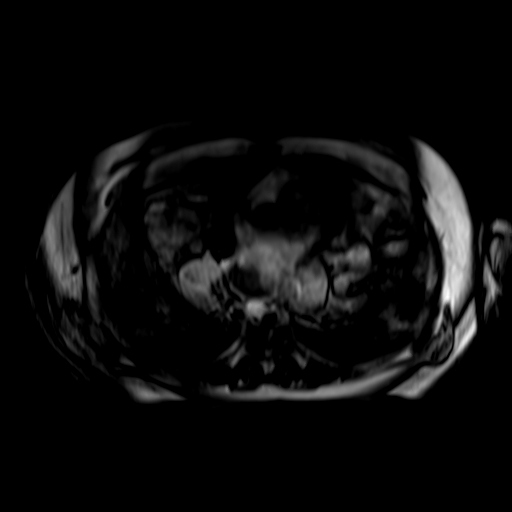

[Series 11: T1 dynamic · axial · 4.4mm · 0.74mm/px · z∈[-170,+72]mm · 3 of 56 slices shown (2 of 5)]
[im 1/56]
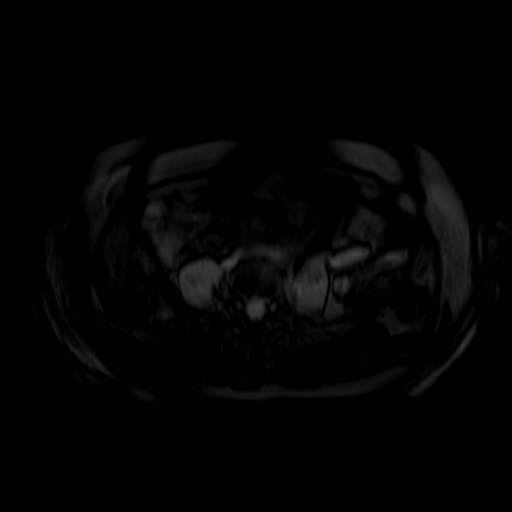
[im 28/56]
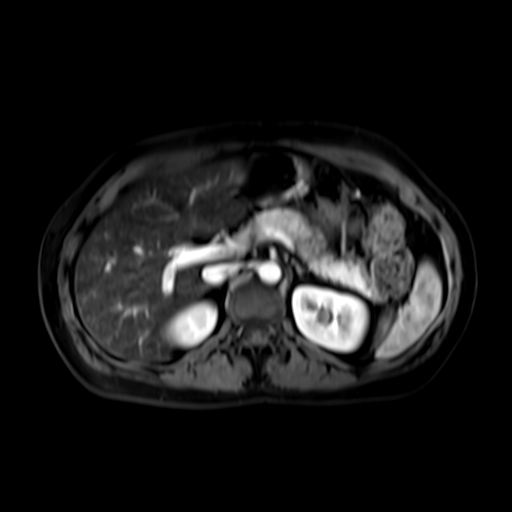
[im 56/56]
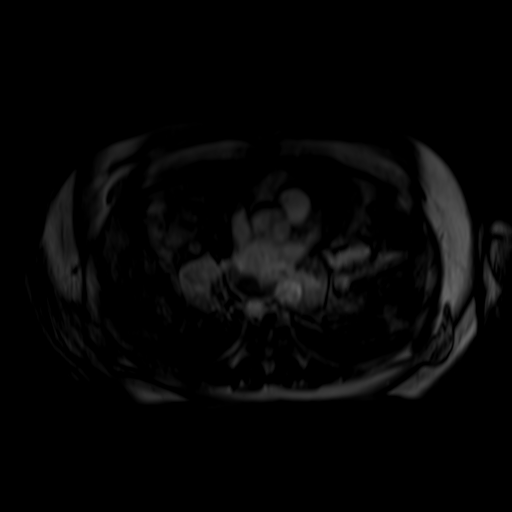

[Series 12: T1 dynamic · axial · 4.4mm · 0.74mm/px · z∈[-170,+72]mm · 3 of 56 slices shown (3 of 5)]
[im 1/56]
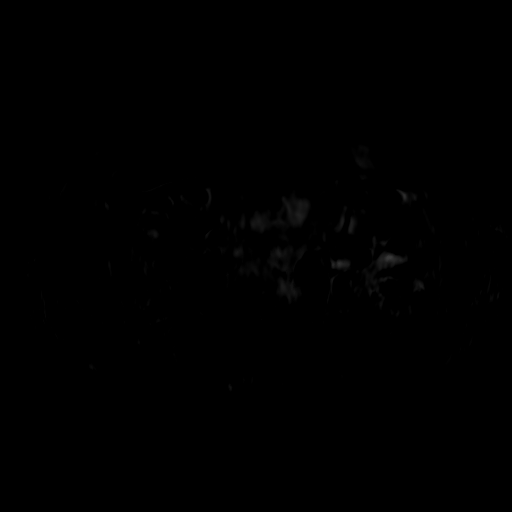
[im 28/56]
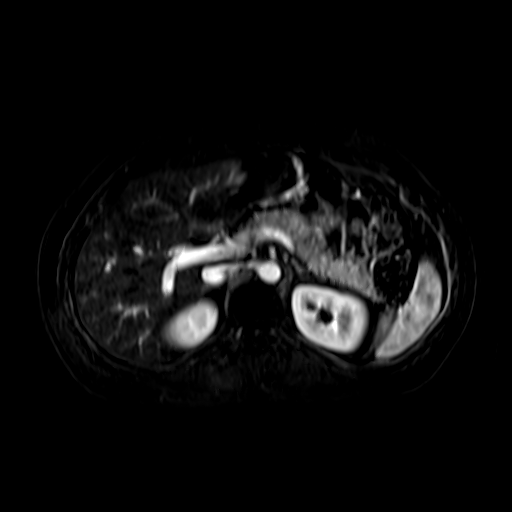
[im 56/56]
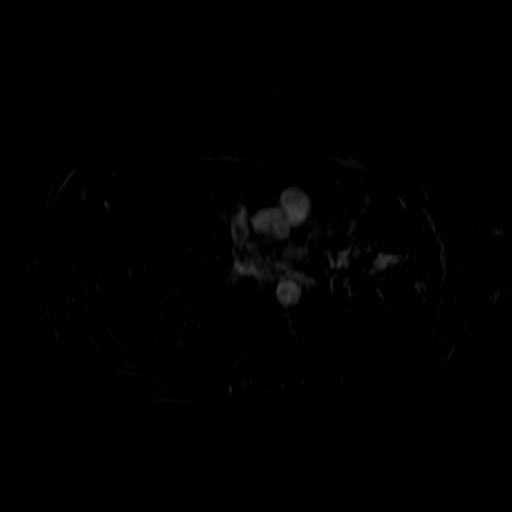

[Series 13: T1 dynamic · axial · 4.4mm · 0.74mm/px · z∈[-170,+72]mm · 3 of 56 slices shown (4 of 5)]
[im 1/56]
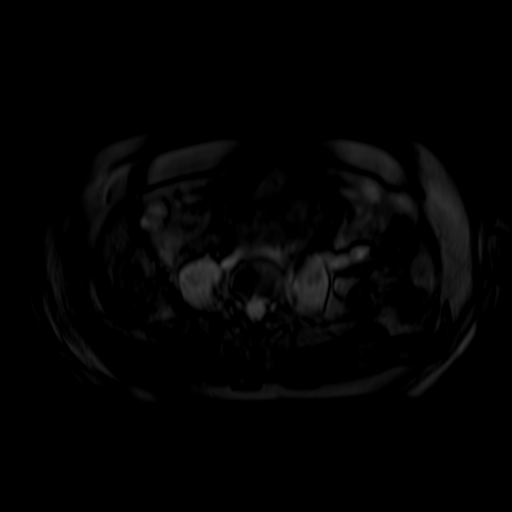
[im 28/56]
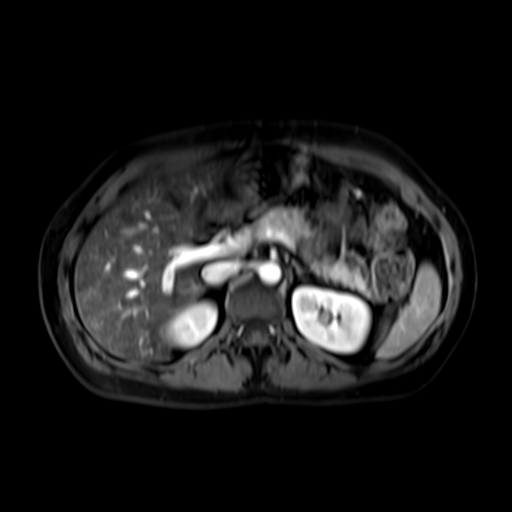
[im 56/56]
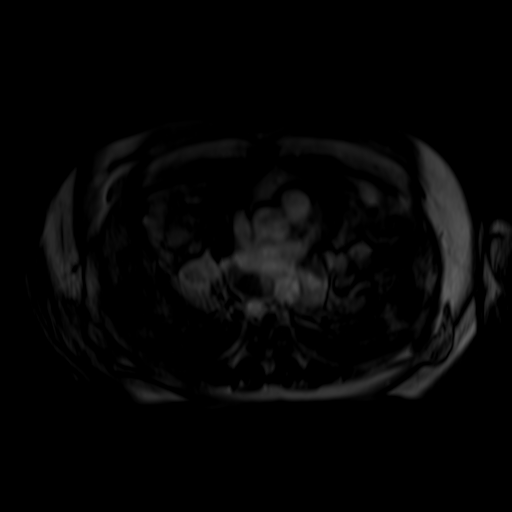

[Series 14: T1 dynamic · axial · 4.4mm · 0.74mm/px · z∈[-170,+72]mm · 3 of 56 slices shown (5 of 5)]
[im 1/56]
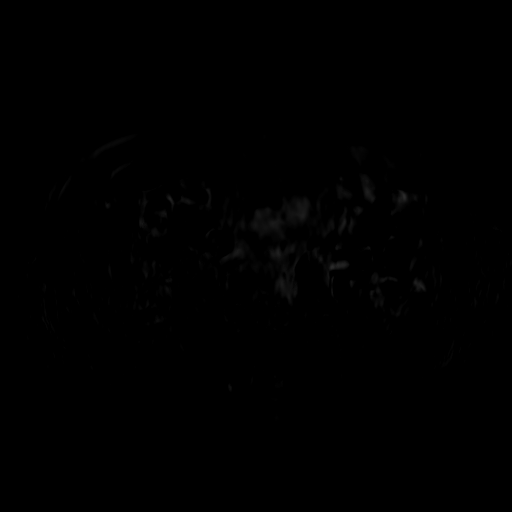
[im 28/56]
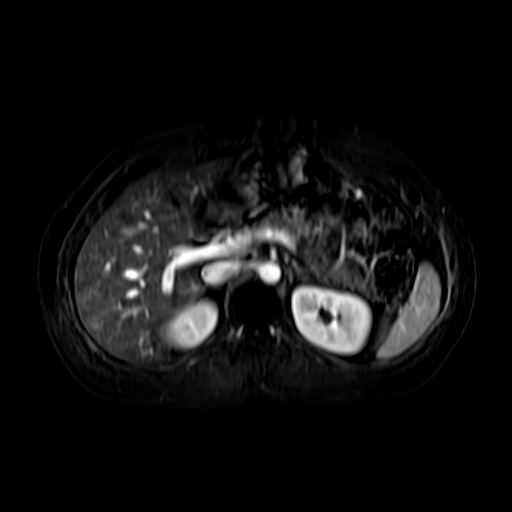
[im 56/56]
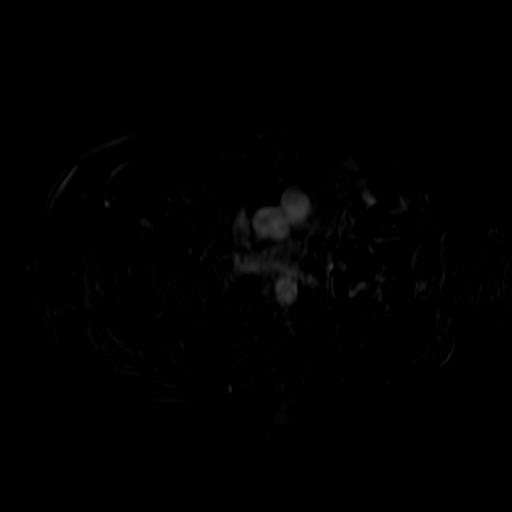

[26 of 48 positions shown; findings below may reference images not displayed]

FINDINGS: Lower chest: Unremarkable.

Hepatobiliary: Severe diffuse loss of signal intensity throughout
the hepatic parenchyma on in phase dual echo images, as well as
diffuse low signal intensity on T2 weighted images, indicative of a
background of severe hepatic iron deposition. No suspicious cystic
or solid hepatic lesions. No intra or extrahepatic biliary ductal
dilatation. Gallbladder is normal in appearance.

Pancreas: No pancreatic mass. No pancreatic ductal dilatation. No
pancreatic or peripancreatic fluid collections or inflammatory
changes.

Spleen: Diffuse loss of signal intensity throughout the splenic
parenchyma on in phase dual echo images, and diffuse low signal
intensity on T2 weighted sequences, indicative of a background of
severe iron deposition. Again noted are some wedge-shaped perfusion
deficits in the spleen associated with some mild contour
abnormality, indicative of chronic splenic infarcts.

Adrenals/Urinary Tract: Bilateral kidneys and adrenal glands are
normal in appearance. No hydroureteronephrosis in the visualized
portions of the abdomen.

Stomach/Bowel: Visualized portions are unremarkable.

Vascular/Lymphatic: No aneurysm identified in the visualized
abdominal vasculature. Splenic and portal veins are patent. No
lymphadenopathy noted in the abdomen.

Other: No significant volume of ascites noted in the visualized
portions of the peritoneal cavity.

Musculoskeletal: No aggressive appearing osseous lesions are noted
in the visualized portions of the skeleton.
IMPRESSION: 1. Chronic splenic infarcts again noted.
2. Interval development of hemosiderosis.

## 2020-11-17 MED ORDER — GADOBUTROL 1 MMOL/ML IV SOLN
6.0000 mL | Freq: Once | INTRAVENOUS | Status: AC | PRN
  Administered 2020-11-17: 6 mL via INTRAVENOUS

## 2020-11-20 ENCOUNTER — Telehealth: Payer: Self-pay | Admitting: *Deleted

## 2020-11-20 ENCOUNTER — Encounter: Payer: Self-pay | Admitting: *Deleted

## 2020-11-20 NOTE — Telephone Encounter (Signed)
-----   Message from Josph Macho, MD sent at 11/19/2020  4:52 PM EST ----- Call - the splenic infarcts are the same.  You have some iron in the liver, but this is expected due to ou getting IV iron. Cindee Lame

## 2020-11-20 NOTE — Telephone Encounter (Signed)
Pt notified per order of Dr. Myna Hidalgo that "the splenic infarcts are the same.  You have some iron in the liver, but this is expected due to getting IV iron.  Amanda Mcconnell" Pt appreciative of call and would like to know if she needs to start back on anticoagulant therapy.  Pt notified per order of Dr. Myna Hidalgo that she does not need to start back on anticoagulant therapy at this time.

## 2020-11-29 ENCOUNTER — Inpatient Hospital Stay: Attending: Hematology & Oncology

## 2020-11-29 ENCOUNTER — Encounter: Payer: Self-pay | Admitting: Family

## 2020-11-29 ENCOUNTER — Telehealth: Payer: Self-pay

## 2020-11-29 ENCOUNTER — Other Ambulatory Visit: Payer: Self-pay

## 2020-11-29 ENCOUNTER — Inpatient Hospital Stay (HOSPITAL_BASED_OUTPATIENT_CLINIC_OR_DEPARTMENT_OTHER): Admitting: Family

## 2020-11-29 VITALS — BP 95/57 | HR 97 | Resp 19 | Wt 134.4 lb

## 2020-11-29 DIAGNOSIS — K654 Sclerosing mesenteritis: Secondary | ICD-10-CM | POA: Insufficient documentation

## 2020-11-29 DIAGNOSIS — K909 Intestinal malabsorption, unspecified: Secondary | ICD-10-CM | POA: Diagnosis not present

## 2020-11-29 DIAGNOSIS — Z7901 Long term (current) use of anticoagulants: Secondary | ICD-10-CM | POA: Insufficient documentation

## 2020-11-29 DIAGNOSIS — D508 Other iron deficiency anemias: Secondary | ICD-10-CM | POA: Insufficient documentation

## 2020-11-29 DIAGNOSIS — D500 Iron deficiency anemia secondary to blood loss (chronic): Secondary | ICD-10-CM

## 2020-11-29 DIAGNOSIS — Z79899 Other long term (current) drug therapy: Secondary | ICD-10-CM | POA: Diagnosis not present

## 2020-11-29 DIAGNOSIS — D735 Infarction of spleen: Secondary | ICD-10-CM | POA: Diagnosis not present

## 2020-11-29 LAB — IRON AND TIBC
Iron: 83 ug/dL (ref 41–142)
Saturation Ratios: 32 % (ref 21–57)
TIBC: 261 ug/dL (ref 236–444)
UIBC: 178 ug/dL (ref 120–384)

## 2020-11-29 LAB — CBC WITH DIFFERENTIAL (CANCER CENTER ONLY)
Abs Immature Granulocytes: 0.14 10*3/uL — ABNORMAL HIGH (ref 0.00–0.07)
Basophils Absolute: 0.1 10*3/uL (ref 0.0–0.1)
Basophils Relative: 1 %
Eosinophils Absolute: 0.2 10*3/uL (ref 0.0–0.5)
Eosinophils Relative: 3 %
HCT: 39.2 % (ref 36.0–46.0)
Hemoglobin: 13.4 g/dL (ref 12.0–15.0)
Immature Granulocytes: 2 %
Lymphocytes Relative: 16 %
Lymphs Abs: 1.2 10*3/uL (ref 0.7–4.0)
MCH: 30.9 pg (ref 26.0–34.0)
MCHC: 34.2 g/dL (ref 30.0–36.0)
MCV: 90.3 fL (ref 80.0–100.0)
Monocytes Absolute: 0.4 10*3/uL (ref 0.1–1.0)
Monocytes Relative: 5 %
Neutro Abs: 5.3 10*3/uL (ref 1.7–7.7)
Neutrophils Relative %: 73 %
Platelet Count: 225 10*3/uL (ref 150–400)
RBC: 4.34 MIL/uL (ref 3.87–5.11)
RDW: 13.4 % (ref 11.5–15.5)
WBC Count: 7.3 10*3/uL (ref 4.0–10.5)
nRBC: 0 % (ref 0.0–0.2)

## 2020-11-29 LAB — CMP (CANCER CENTER ONLY)
ALT: 16 U/L (ref 0–44)
AST: 18 U/L (ref 15–41)
Albumin: 4.1 g/dL (ref 3.5–5.0)
Alkaline Phosphatase: 88 U/L (ref 38–126)
Anion gap: 4 — ABNORMAL LOW (ref 5–15)
BUN: 7 mg/dL (ref 6–20)
CO2: 31 mmol/L (ref 22–32)
Calcium: 9.1 mg/dL (ref 8.9–10.3)
Chloride: 105 mmol/L (ref 98–111)
Creatinine: 0.69 mg/dL (ref 0.44–1.00)
GFR, Estimated: >60 mL/min (ref >60)
Glucose, Bld: 114 mg/dL — ABNORMAL HIGH (ref 70–99)
Potassium: 4 mmol/L (ref 3.5–5.1)
Sodium: 140 mmol/L (ref 135–145)
Total Bilirubin: 0.4 mg/dL (ref 0.3–1.2)
Total Protein: 6.1 g/dL — ABNORMAL LOW (ref 6.5–8.1)

## 2020-11-29 LAB — RETICULOCYTES
Immature Retic Fract: 3.5 % (ref 2.3–15.9)
RBC.: 4.3 MIL/uL (ref 3.87–5.11)
Retic Count, Absolute: 79.1 10*3/uL (ref 19.0–186.0)
Retic Ct Pct: 1.8 % (ref 0.4–3.1)

## 2020-11-29 LAB — LACTATE DEHYDROGENASE: LDH: 168 U/L (ref 98–192)

## 2020-11-29 LAB — FERRITIN: Ferritin: 311 ng/mL — ABNORMAL HIGH (ref 11–307)

## 2020-11-29 NOTE — Telephone Encounter (Signed)
Pt is aware of los and appts due and will check her sch and will call me when she has the best dates    Amanda Mcconnell

## 2020-11-29 NOTE — Progress Notes (Addendum)
Hematology and Oncology Follow Up Visit  Amanda Mcconnell [id number] 1970-11-28 50 y.o. 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 She is doing well and feeling so much better since she received IV iron. Hgb is now 13.4, MCV 90, WBC count is 7.3 and platelets 225.  She had an MRI of the abdomen showed splenic infarcts to be stable/unchanged and she did have iron noted and iron was noted in the liver due to recent infusion.  She has not noted any blood loss. No bruising or petechiae.  She is currently on antibiotics for a UTI. She states that she has had issues for a bit and that she may be referred to a urologist for further work up.  She states that she is waiting to schedule with Amanda new Rheumatologist (Amanda Mcconnell has moved).  She states that she is doing well on the Methotrexate as well as the Plaquenil (Lupus).  No fever, chills, n/v, cough, rash, dizziness, SOB, chest pain, palpitations, abdominal pain/bloating or changes in bowel habits.  No numbness or tingling in Amanda extremities at this time.  She did have a fall a week and a half ago and states that Amanda MRI showed a torn ligament in Amanda right shoulder.  No syncope.  She has maintained a good appetite and is staying well hydrated. Amanda weight is stable.   ECOG Performance Status: 1 - Symptomatic but completely ambulatory  Medications:  Allergies as of 11/29/2020      Reactions   Doxycycline Swelling   Penicillins Hives   Penicillin G Rash      Medication List       Accurate as of November 29, 2020  9:29 AM. If you have any questions, ask your nurse or doctor.        albuterol (2.5 MG/3ML) 0.083% nebulizer solution Commonly known as: PROVENTIL Take 2.5  mg by nebulization every 6 (six) hours as needed.   amphetamine-dextroamphetamine 30 MG tablet Commonly known as: ADDERALL Take by mouth daily.   citalopram 20 MG tablet Commonly known as: CELEXA Take 40 mg by mouth daily.   hydrocortisone 2.5 % cream Apply to rash twice a day as needed   hydroxychloroquine 200 MG tablet Commonly known as: PLAQUENIL Take 200 mg by mouth daily.   methotrexate 2.5 MG tablet Commonly known as: RHEUMATREX Take 2.5 mg by mouth once a week. Caution:Chemotherapy. Protect from light. Take six tablets total of 15 mg by mouth once a week.   omeprazole 20 MG capsule Commonly known as: PRILOSEC Take 1 capsule (20 mg total) by mouth daily. Take 30 minutes before breakfast.   ondansetron 4 MG disintegrating tablet Commonly known as: ZOFRAN-ODT Take 4 mg by mouth every 6 (six) hours as needed.   oxyCODONE-acetaminophen 5-325 MG tablet Commonly known as: PERCOCET/ROXICET Take 1 tablet by mouth every 4 (four) hours.   predniSONE 5 MG tablet Commonly known as: DELTASONE Take 5 mg by mouth daily with breakfast.   traZODone 100 MG tablet Commonly known as: DESYREL Take 100 mg by mouth at bedtime.       Allergies:  Allergies  Allergen Reactions  . Doxycycline Swelling  . Penicillins Hives  . Penicillin G  Rash    Past Medical History, Surgical history, Social history, and Family History were reviewed and updated.  Review of Systems: All Mcconnell 10 point review of systems is negative.   Physical Exam:  vitals were not taken for this visit.   Wt Readings from Last 3 Encounters:  10/15/20 130 lb (59 kg)  09/03/20 132 lb 12 oz (60.2 kg)  07/26/20 127 lb (57.6 kg)    Ocular: Sclerae unicteric, pupils equal, round and reactive to light Ear-nose-throat: Oropharynx clear, dentition fair Lymphatic: No cervical or supraclavicular adenopathy Lungs no rales or rhonchi, good excursion bilaterally Heart regular rate and rhythm, no murmur  appreciated Abd soft, nontender, positive bowel sounds MSK no focal spinal tenderness, no joint edema Neuro: non-focal, well-oriented, appropriate affect Breasts: Deferred   Lab Results  Component Value Date   WBC 7.3 11/29/2020   HGB 13.4 11/29/2020   HCT 39.2 11/29/2020   MCV 90.3 11/29/2020   PLT 225 11/29/2020   Lab Results  Component Value Date   FERRITIN 702 (H) 10/15/2020   IRON 80 10/15/2020   TIBC 283 10/15/2020   UIBC 202 10/15/2020   IRONPCTSAT 28 10/15/2020   Lab Results  Component Value Date   RETICCTPCT 1.8 11/29/2020   RBC 4.30 11/29/2020   No results found for: KPAFRELGTCHN, LAMBDASER, KAPLAMBRATIO No results found for: IGGSERUM, IGA, IGMSERUM No results found for: Marda Stalker, SPEI   Chemistry      Component Value Date/Time   NA 140 11/16/2020 1412   K 4.1 11/16/2020 1412   CL 103 11/16/2020 1412   CO2 31 11/16/2020 1412   BUN 9 11/16/2020 1412   CREATININE 0.73 11/16/2020 1412      Component Value Date/Time   CALCIUM 9.3 11/16/2020 1412   ALKPHOS 88 11/16/2020 1412   AST 17 11/16/2020 1412   ALT 18 11/16/2020 1412   BILITOT 0.6 11/16/2020 1412       Impression and Plan: 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 She completed treatment with anticoagulation.  MRI in February showed splenic infarcts to be stable. No need to restart anticoagulation at this time per Dr. Myna Hidalgo.  Iron studies are pending. We can replace if needed.  Follow-up in 4 months.  She was encouraged to contact our office with any questions or concerns.   Emeline Gins, NP 3/10/20229:29 AM

## 2021-07-01 ENCOUNTER — Other Ambulatory Visit (HOSPITAL_BASED_OUTPATIENT_CLINIC_OR_DEPARTMENT_OTHER): Payer: Self-pay

## 2021-07-01 ENCOUNTER — Encounter: Payer: Self-pay | Admitting: Hematology & Oncology

## 2021-07-01 MED ORDER — INFLUENZA VAC SPLIT QUAD 0.5 ML IM SUSY
PREFILLED_SYRINGE | INTRAMUSCULAR | 0 refills | Status: DC
  Filled 2021-07-01: qty 0.5, 1d supply, fill #0

## 2021-08-19 ENCOUNTER — Encounter (HOSPITAL_BASED_OUTPATIENT_CLINIC_OR_DEPARTMENT_OTHER): Payer: Self-pay | Admitting: Urology

## 2021-08-19 ENCOUNTER — Other Ambulatory Visit: Payer: Self-pay

## 2021-08-19 ENCOUNTER — Emergency Department (HOSPITAL_BASED_OUTPATIENT_CLINIC_OR_DEPARTMENT_OTHER)
Admission: EM | Admit: 2021-08-19 | Discharge: 2021-08-19 | Disposition: A | Discharge status: Home / Self Care | Attending: Emergency Medical Services | Admitting: Emergency Medical Services

## 2021-08-19 DIAGNOSIS — Z5321 Procedure and treatment not carried out due to patient leaving prior to being seen by health care provider: Secondary | ICD-10-CM | POA: Diagnosis not present

## 2021-08-19 DIAGNOSIS — R42 Dizziness and giddiness: Secondary | ICD-10-CM | POA: Insufficient documentation

## 2021-08-19 DIAGNOSIS — W19XXXA Unspecified fall, initial encounter: Secondary | ICD-10-CM | POA: Insufficient documentation

## 2021-08-19 NOTE — ED Triage Notes (Signed)
Fall today, went to [facility] for dizziness that caused fall Sent here r/t abnormal EKG Got rx for inner ear infection at [facility]

## 2021-10-11 ENCOUNTER — Telehealth: Payer: Self-pay | Admitting: *Deleted

## 2021-10-11 NOTE — Telephone Encounter (Signed)
Call received from patient stating that her PCP suggests she make an appt with this office d/t her HGB is elevated from labs drawn last week at her PCP's office. Requested that pt have her PCP fax most current labs to [phone].  Message sent to scheduling.

## 2021-10-14 ENCOUNTER — Telehealth: Payer: Self-pay | Admitting: Family

## 2021-10-16 ENCOUNTER — Inpatient Hospital Stay (HOSPITAL_BASED_OUTPATIENT_CLINIC_OR_DEPARTMENT_OTHER): Admitting: Family

## 2021-10-16 ENCOUNTER — Encounter: Payer: Self-pay | Admitting: Family

## 2021-10-16 ENCOUNTER — Other Ambulatory Visit: Payer: Self-pay

## 2021-10-16 ENCOUNTER — Inpatient Hospital Stay: Attending: Family

## 2021-10-16 VITALS — BP 93/55 | HR 95 | Temp 98.6°F | Resp 18 | Ht 64.17 in | Wt 140.8 lb

## 2021-10-16 DIAGNOSIS — Z880 Allergy status to penicillin: Secondary | ICD-10-CM | POA: Diagnosis not present

## 2021-10-16 DIAGNOSIS — R202 Paresthesia of skin: Secondary | ICD-10-CM | POA: Diagnosis not present

## 2021-10-16 DIAGNOSIS — Z881 Allergy status to other antibiotic agents status: Secondary | ICD-10-CM | POA: Insufficient documentation

## 2021-10-16 DIAGNOSIS — D508 Other iron deficiency anemias: Secondary | ICD-10-CM | POA: Insufficient documentation

## 2021-10-16 DIAGNOSIS — D500 Iron deficiency anemia secondary to blood loss (chronic): Secondary | ICD-10-CM | POA: Diagnosis not present

## 2021-10-16 DIAGNOSIS — Z79899 Other long term (current) drug therapy: Secondary | ICD-10-CM | POA: Insufficient documentation

## 2021-10-16 DIAGNOSIS — R42 Dizziness and giddiness: Secondary | ICD-10-CM | POA: Diagnosis not present

## 2021-10-16 DIAGNOSIS — K909 Intestinal malabsorption, unspecified: Secondary | ICD-10-CM | POA: Diagnosis not present

## 2021-10-16 DIAGNOSIS — Z7901 Long term (current) use of anticoagulants: Secondary | ICD-10-CM | POA: Diagnosis not present

## 2021-10-16 DIAGNOSIS — D735 Infarction of spleen: Secondary | ICD-10-CM

## 2021-10-16 DIAGNOSIS — K654 Sclerosing mesenteritis: Secondary | ICD-10-CM | POA: Insufficient documentation

## 2021-10-16 LAB — CBC WITH DIFFERENTIAL (CANCER CENTER ONLY)
Abs Immature Granulocytes: 0.08 10*3/uL — ABNORMAL HIGH (ref 0.00–0.07)
Basophils Absolute: 0.1 10*3/uL (ref 0.0–0.1)
Basophils Relative: 1 %
Eosinophils Absolute: 0.2 10*3/uL (ref 0.0–0.5)
Eosinophils Relative: 3 %
HCT: 40.8 % (ref 36.0–46.0)
Hemoglobin: 14.4 g/dL (ref 12.0–15.0)
Immature Granulocytes: 1 %
Lymphocytes Relative: 21 %
Lymphs Abs: 1.5 10*3/uL (ref 0.7–4.0)
MCH: 32.1 pg (ref 26.0–34.0)
MCHC: 35.3 g/dL (ref 30.0–36.0)
MCV: 91.1 fL (ref 80.0–100.0)
Monocytes Absolute: 0.4 10*3/uL (ref 0.1–1.0)
Monocytes Relative: 6 %
Neutro Abs: 5 10*3/uL (ref 1.7–7.7)
Neutrophils Relative %: 68 %
Platelet Count: 250 10*3/uL (ref 150–400)
RBC: 4.48 MIL/uL (ref 3.87–5.11)
RDW: 12.1 % (ref 11.5–15.5)
WBC Count: 7.3 10*3/uL (ref 4.0–10.5)
nRBC: 0 % (ref 0.0–0.2)

## 2021-10-16 LAB — CMP (CANCER CENTER ONLY)
ALT: 18 U/L (ref 0–44)
AST: 17 U/L (ref 15–41)
Albumin: 4.4 g/dL (ref 3.5–5.0)
Alkaline Phosphatase: 83 U/L (ref 38–126)
Anion gap: 8 (ref 5–15)
BUN: 8 mg/dL (ref 6–20)
CO2: 28 mmol/L (ref 22–32)
Calcium: 9.6 mg/dL (ref 8.9–10.3)
Chloride: 103 mmol/L (ref 98–111)
Creatinine: 0.71 mg/dL (ref 0.44–1.00)
GFR, Estimated: >60 mL/min (ref >60)
Glucose, Bld: 88 mg/dL (ref 70–99)
Potassium: 3.9 mmol/L (ref 3.5–5.1)
Sodium: 139 mmol/L (ref 135–145)
Total Bilirubin: 0.7 mg/dL (ref 0.3–1.2)
Total Protein: 6.3 g/dL — ABNORMAL LOW (ref 6.5–8.1)

## 2021-10-16 LAB — RETICULOCYTES
Immature Retic Fract: 4.6 % (ref 2.3–15.9)
RBC.: 4.47 MIL/uL (ref 3.87–5.11)
Retic Count, Absolute: 80.9 10*3/uL (ref 19.0–186.0)
Retic Ct Pct: 1.8 % (ref 0.4–3.1)

## 2021-10-16 LAB — LACTATE DEHYDROGENASE: LDH: 197 U/L — ABNORMAL HIGH (ref 98–192)

## 2021-10-16 NOTE — Progress Notes (Signed)
Hematology and Oncology Follow Up Visit  Amanda Mcconnell [id number] 1971-09-20 51 y.o. 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 She is doing fairly well but has noted dizziness while looking up and painting today. She also had an episodes where she was dizzy and fell a couple weeks ago. Thankfully she states she was not seriously injured.  Her BP today is 93/55, HR 95. She is asymptomatic at this time. She will get a cuff and start checking twice a day keeping a record and follow-up with her PCP.  She states that she does not have an appetite and isn't eating well. She states that she can go all day without eating. She drinks mostly Dr. Reino Mcconnell. Her weight is stable at 140 lbs.  She states that her sclerosing mesenteritis and lupus are stable. She continues to take Methotrexate and Plaquenil.  No fever, chills, n/v, cough, rash, SOB, chest pain, palpitations, abdominal pain or changes in bowel or bladder habits.  No blood loss noted. No abnormal bruising, no petechiae.  No swelling in her extremities at this time. She has carpal tunnel in her hands and intermittent positional tingling.   ECOG Performance Status: 1 - Symptomatic but completely ambulatory  Medications:  Allergies as of 10/16/2021       Reactions   Doxycycline Swelling   Penicillins Hives   Penicillin G Rash        Medication List        Accurate as of October 16, 2021  2:48 PM. If you have any questions, ask your nurse or doctor.          albuterol (2.5 MG/3ML) 0.083% nebulizer solution Commonly known as: PROVENTIL Take 2.5 mg by nebulization every 6 (six) hours as needed.   amphetamine-dextroamphetamine 30 MG tablet Commonly known  as: ADDERALL Take by mouth daily.   ciprofloxacin 500 MG tablet Commonly known as: CIPRO ciprofloxacin 500 mg tablet   citalopram 20 MG tablet Commonly known as: CELEXA Take 40 mg by mouth daily.   Fluarix Quadrivalent 0.5 ML injection Generic drug: influenza vac split quadrivalent PF Inject into the muscle.   FOLIC ACID PO folic acid   hydrocortisone 2.5 % cream Apply to rash twice a day as needed   hydroxychloroquine 200 MG tablet Commonly known as: PLAQUENIL Take 200 mg by mouth daily.   methotrexate 2.5 MG tablet Commonly known as: RHEUMATREX Take 2.5 mg by mouth once a week. Caution:Chemotherapy. Protect from light. Take six tablets total of 15 mg by mouth once a week.   omeprazole 40 MG capsule Commonly known as: PRILOSEC Take 1 capsule by mouth daily.   ondansetron 4 MG disintegrating tablet Commonly known as: ZOFRAN-ODT Take 4 mg by mouth every 6 (six) hours as needed.   oxyCODONE-acetaminophen 5-325 MG tablet Commonly known as: PERCOCET/ROXICET Take 1 tablet by mouth every 4 (four) hours.   predniSONE 5 MG tablet Commonly known as: DELTASONE Take 5 mg by mouth daily with breakfast.   traMADol 50 MG tablet Commonly known as: ULTRAM tramadol 50 mg tablet  Take 1 tablet every 8 hours by oral route for 10 days.   traZODone 100 MG tablet Commonly known as: DESYREL Take  100 mg by mouth at bedtime.   XATMEP PO methotrexate        Allergies:  Allergies  Allergen Reactions   Doxycycline Swelling   Penicillins Hives   Penicillin G Rash    Past Medical History, Surgical history, Social history, and Family History were reviewed and updated.  Review of Systems: All other 10 point review of systems is negative.   Physical Exam:  vitals were not taken for this visit.   Wt Readings from Last 3 Encounters:  08/19/21 134 lb 7.7 oz (61 kg)  11/29/20 134 lb 6.4 oz (61 kg)  10/15/20 130 lb (59 kg)    Ocular: Sclerae unicteric, pupils equal, round  and reactive to light Ear-nose-throat: Oropharynx clear, dentition fair Lymphatic: No cervical or supraclavicular adenopathy Lungs no rales or rhonchi, good excursion bilaterally Heart regular rate and rhythm, no murmur appreciated Abd soft, nontender, positive bowel sounds MSK no focal spinal tenderness, no joint edema Neuro: non-focal, well-oriented, appropriate affect Breasts: Deferred   Lab Results  Component Value Date   WBC 7.3 10/16/2021   HGB 14.4 10/16/2021   HCT 40.8 10/16/2021   MCV 91.1 10/16/2021   PLT 250 10/16/2021   Lab Results  Component Value Date   FERRITIN 311 (H) 11/29/2020   IRON 83 11/29/2020   TIBC 261 11/29/2020   UIBC 178 11/29/2020   IRONPCTSAT 32 11/29/2020   Lab Results  Component Value Date   RETICCTPCT 1.8 10/16/2021   RBC 4.48 10/16/2021   RBC 4.47 10/16/2021   No results found for: KPAFRELGTCHN, LAMBDASER, KAPLAMBRATIO No results found for: IGGSERUM, IGA, IGMSERUM No results found for: Amanda Mcconnell, SPEI   Chemistry      Component Value Date/Time   NA 140 11/29/2020 0914   K 4.0 11/29/2020 0914   CL 105 11/29/2020 0914   CO2 31 11/29/2020 0914   BUN 7 11/29/2020 0914   CREATININE 0.69 11/29/2020 0914      Component Value Date/Time   CALCIUM 9.1 11/29/2020 0914   ALKPHOS 88 11/29/2020 0914   AST 18 11/29/2020 0914   ALT 16 11/29/2020 0914   BILITOT 0.4 11/29/2020 0914       Impression and Plan: Amanda Mcconnell is a very pleasant 51 yo caucasian female with splenic infarcts and 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 She completed treatment with anticoagulation. Stable splenic infarcts noted on MRI February 2022.  Iron studies are pending. We will replace if needed.  Follow-up in 6 months.   Amanda Stanford, NP 1/25/20232:48 PM

## 2021-10-17 LAB — IRON AND IRON BINDING CAPACITY (CC-WL,HP ONLY)
Iron: 93 ug/dL (ref 28–170)
Saturation Ratios: 28 % (ref 10.4–31.8)
TIBC: 337 ug/dL (ref 250–450)
UIBC: 244 ug/dL (ref 148–442)

## 2021-10-17 LAB — FERRITIN: Ferritin: 297 ng/mL (ref 11–307)

## 2021-10-18 ENCOUNTER — Telehealth: Payer: Self-pay | Admitting: *Deleted

## 2021-10-18 NOTE — Telephone Encounter (Signed)
Call received from patient requesting most recent lab results.  Pt notified per order of S. Eulas Post NP that iron results are WNL's at this time. Pt appreciative of assistance and has no further questions at this time.

## 2021-11-01 ENCOUNTER — Other Ambulatory Visit: Payer: Self-pay | Admitting: Gastroenterology

## 2021-11-02 ENCOUNTER — Other Ambulatory Visit: Payer: Self-pay | Admitting: Gastroenterology

## 2022-04-15 ENCOUNTER — Encounter: Payer: Self-pay | Admitting: Family

## 2022-04-15 ENCOUNTER — Inpatient Hospital Stay: Attending: Family

## 2022-04-15 ENCOUNTER — Inpatient Hospital Stay (HOSPITAL_BASED_OUTPATIENT_CLINIC_OR_DEPARTMENT_OTHER): Admitting: Family

## 2022-04-15 VITALS — BP 112/65 | HR 87 | Temp 97.8°F | Resp 18 | Wt 148.0 lb

## 2022-04-15 DIAGNOSIS — D735 Infarction of spleen: Secondary | ICD-10-CM | POA: Diagnosis present

## 2022-04-15 DIAGNOSIS — T82868S Thrombosis of vascular prosthetic devices, implants and grafts, sequela: Secondary | ICD-10-CM

## 2022-04-15 DIAGNOSIS — D500 Iron deficiency anemia secondary to blood loss (chronic): Secondary | ICD-10-CM | POA: Diagnosis not present

## 2022-04-15 LAB — FERRITIN: Ferritin: 107 ng/mL (ref 11–307)

## 2022-04-15 LAB — CBC WITH DIFFERENTIAL (CANCER CENTER ONLY)
Abs Immature Granulocytes: 0.1 10*3/uL — ABNORMAL HIGH (ref 0.00–0.07)
Basophils Absolute: 0.1 10*3/uL (ref 0.0–0.1)
Basophils Relative: 1 %
Eosinophils Absolute: 0.3 10*3/uL (ref 0.0–0.5)
Eosinophils Relative: 4 %
HCT: 38.8 % (ref 36.0–46.0)
Hemoglobin: 13.2 g/dL (ref 12.0–15.0)
Immature Granulocytes: 2 %
Lymphocytes Relative: 20 %
Lymphs Abs: 1.3 10*3/uL (ref 0.7–4.0)
MCH: 31.7 pg (ref 26.0–34.0)
MCHC: 34 g/dL (ref 30.0–36.0)
MCV: 93 fL (ref 80.0–100.0)
Monocytes Absolute: 0.4 10*3/uL (ref 0.1–1.0)
Monocytes Relative: 7 %
Neutro Abs: 4.4 10*3/uL (ref 1.7–7.7)
Neutrophils Relative %: 66 %
Platelet Count: 199 10*3/uL (ref 150–400)
RBC: 4.17 MIL/uL (ref 3.87–5.11)
RDW: 12.5 % (ref 11.5–15.5)
WBC Count: 6.5 10*3/uL (ref 4.0–10.5)
nRBC: 0 % (ref 0.0–0.2)

## 2022-04-15 LAB — CMP (CANCER CENTER ONLY)
ALT: 15 U/L (ref 0–44)
AST: 16 U/L (ref 15–41)
Albumin: 4.1 g/dL (ref 3.5–5.0)
Alkaline Phosphatase: 73 U/L (ref 38–126)
Anion gap: 7 (ref 5–15)
BUN: 11 mg/dL (ref 6–20)
CO2: 27 mmol/L (ref 22–32)
Calcium: 8.7 mg/dL — ABNORMAL LOW (ref 8.9–10.3)
Chloride: 103 mmol/L (ref 98–111)
Creatinine: 0.72 mg/dL (ref 0.44–1.00)
GFR, Estimated: >60 mL/min (ref >60)
Glucose, Bld: 146 mg/dL — ABNORMAL HIGH (ref 70–99)
Potassium: 3.7 mmol/L (ref 3.5–5.1)
Sodium: 137 mmol/L (ref 135–145)
Total Bilirubin: 0.4 mg/dL (ref 0.3–1.2)
Total Protein: 5.7 g/dL — ABNORMAL LOW (ref 6.5–8.1)

## 2022-04-15 LAB — RETICULOCYTES
Immature Retic Fract: 7.9 % (ref 2.3–15.9)
RBC.: 4.12 MIL/uL (ref 3.87–5.11)
Retic Count, Absolute: 96.8 10*3/uL (ref 19.0–186.0)
Retic Ct Pct: 2.4 % (ref 0.4–3.1)

## 2022-04-15 LAB — LACTATE DEHYDROGENASE: LDH: 213 U/L — ABNORMAL HIGH (ref 98–192)

## 2022-04-15 NOTE — Progress Notes (Signed)
Hematology and Oncology Follow Up Visit  Amanda Mcconnell [id number] 1971/09/22 51 y.o. 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 She is doing well but does note some mild fatigue at times. She states that she is doing well on Methotrexate and Plaquenil and has not had any recent flares with Lupus or the sclerosing mesenteritis.  No fever, chills, n/v, cough, rash, dizziness, SOB, chest pain, palpitations, abdominal pain or changes in bowel or bladder habits.  No swelling in her extremities.  She has carpal tunnel in both wrists and is followed by PCP.  No falls or syncope reported.  No blood loss, bruising or petechiae.  Appetite and hydration good. Weight is stable at 148 lbs.   ECOG Performance Status: 0 - Asymptomatic  Medications:  Allergies as of 04/15/2022       Reactions   Doxycycline Swelling   Penicillins Hives   Penicillin G Rash        Medication List        Accurate as of April 15, 2022  2:23 PM. If you have any questions, ask your nurse or doctor.          albuterol (2.5 MG/3ML) 0.083% nebulizer solution Commonly known as: PROVENTIL Take 2.5 mg by nebulization every 6 (six) hours as needed.   ALPRAZolam 1 MG tablet Commonly known as: XANAX Take 1 mg by mouth 2 (two) times daily as needed.   amphetamine-dextroamphetamine 30 MG tablet Commonly known as: ADDERALL Take by mouth daily.   citalopram 20 MG tablet Commonly known as: CELEXA Take 40 mg by mouth daily.   FOLIC ACID PO folic acid   hydrocortisone 2.5 % cream Apply to rash twice a day as needed   hydroxychloroquine 200 MG tablet Commonly known as: PLAQUENIL Take 200 mg by mouth daily.   methotrexate 2.5 MG  tablet Commonly known as: RHEUMATREX Take 2.5 mg by mouth once a week. Caution:Chemotherapy. Protect from light. Take six tablets total of 15 mg by mouth once a week.   omeprazole 40 MG capsule Commonly known as: PRILOSEC Take 1 capsule by mouth daily.   ondansetron 4 MG disintegrating tablet Commonly known as: ZOFRAN-ODT Take 4 mg by mouth every 6 (six) hours as needed.        Allergies:  Allergies  Allergen Reactions   Doxycycline Swelling   Penicillins Hives   Penicillin G Rash    Past Medical History, Surgical history, Social history, and Family History were reviewed and updated.  Review of Systems: All other 10 point review of systems is negative.   Physical Exam:  weight is 148 lb (67.1 kg). Her oral temperature is 97.8 F (36.6 C). Her blood pressure is 112/65 and her pulse is 87. Her respiration is 18 and oxygen saturation is 98%.   Wt Readings from Last 3 Encounters:  04/15/22 148 lb (67.1 kg)  10/16/21 140 lb 12.8 oz (63.9 kg)  08/19/21 134 lb 7.7 oz (61 kg)    Ocular: Sclerae unicteric, pupils equal, round and reactive to light Ear-nose-throat: Oropharynx clear, dentition fair Lymphatic: No cervical or supraclavicular adenopathy Lungs no rales or rhonchi, good excursion bilaterally Heart regular rate and rhythm, no murmur appreciated  Abd soft, nontender, positive bowel sounds MSK no focal spinal tenderness, no joint edema Neuro: non-focal, well-oriented, appropriate affect Breasts: Deferred   Lab Results  Component Value Date   WBC 6.5 04/15/2022   HGB 13.2 04/15/2022   HCT 38.8 04/15/2022   MCV 93.0 04/15/2022   PLT 199 04/15/2022   Lab Results  Component Value Date   FERRITIN 297 10/16/2021   IRON 93 10/16/2021   TIBC 337 10/16/2021   UIBC 244 10/16/2021   IRONPCTSAT 28 10/16/2021   Lab Results  Component Value Date   RETICCTPCT 2.4 04/15/2022   RBC 4.17 04/15/2022   RBC 4.12 04/15/2022   No results found for: "KPAFRELGTCHN",  "LAMBDASER", "KAPLAMBRATIO" No results found for: "IGGSERUM", "IGA", "IGMSERUM" No results found for: "TOTALPROTELP", "ALBUMINELP", "A1GS", "A2GS", "BETS", "BETA2SER", "GAMS", "MSPIKE", "SPEI"   Chemistry      Component Value Date/Time   NA 139 10/16/2021 1434   K 3.9 10/16/2021 1434   CL 103 10/16/2021 1434   CO2 28 10/16/2021 1434   BUN 8 10/16/2021 1434   CREATININE 0.71 10/16/2021 1434      Component Value Date/Time   CALCIUM 9.6 10/16/2021 1434   ALKPHOS 83 10/16/2021 1434   AST 17 10/16/2021 1434   ALT 18 10/16/2021 1434   BILITOT 0.7 10/16/2021 1434       Impression and Plan: Amanda Mcconnell is a very pleasant 51 yo caucasian female with splenic infarcts and 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 She completed treatment with anticoagulation. Stable splenic infarcts noted on MRI February 2022.  Iron studies are pending.  Follow-up in 1 year.   Amanda Stanford, NP 7/25/20232:23 PM

## 2022-04-16 ENCOUNTER — Telehealth: Payer: Self-pay | Admitting: *Deleted

## 2022-04-16 LAB — IRON AND IRON BINDING CAPACITY (CC-WL,HP ONLY)
Iron: 70 ug/dL (ref 28–170)
Saturation Ratios: 23 % (ref 10.4–31.8)
TIBC: 307 ug/dL (ref 250–450)
UIBC: 237 ug/dL (ref 148–442)

## 2022-04-16 NOTE — Telephone Encounter (Signed)
Per 04/15/22 los - called and gave upcoming appointments - confirmed 

## 2023-04-16 ENCOUNTER — Other Ambulatory Visit: Payer: Self-pay

## 2023-04-16 DIAGNOSIS — D500 Iron deficiency anemia secondary to blood loss (chronic): Secondary | ICD-10-CM

## 2023-04-17 ENCOUNTER — Inpatient Hospital Stay: Admitting: Hematology & Oncology

## 2023-04-17 ENCOUNTER — Inpatient Hospital Stay

## 2023-04-20 ENCOUNTER — Inpatient Hospital Stay: Attending: Hematology & Oncology

## 2023-04-20 ENCOUNTER — Inpatient Hospital Stay: Admitting: Medical Oncology

## 2023-04-20 ENCOUNTER — Other Ambulatory Visit: Payer: Self-pay

## 2023-04-20 ENCOUNTER — Encounter: Payer: Self-pay | Admitting: Medical Oncology

## 2023-04-20 VITALS — BP 101/52 | HR 93 | Temp 98.4°F | Resp 18 | Ht 64.17 in | Wt 133.0 lb

## 2023-04-20 DIAGNOSIS — K654 Sclerosing mesenteritis: Secondary | ICD-10-CM | POA: Insufficient documentation

## 2023-04-20 DIAGNOSIS — D735 Infarction of spleen: Secondary | ICD-10-CM | POA: Diagnosis present

## 2023-04-20 DIAGNOSIS — D509 Iron deficiency anemia, unspecified: Secondary | ICD-10-CM | POA: Insufficient documentation

## 2023-04-20 DIAGNOSIS — T82868S Thrombosis of vascular prosthetic devices, implants and grafts, sequela: Secondary | ICD-10-CM | POA: Diagnosis not present

## 2023-04-20 DIAGNOSIS — Z79899 Other long term (current) drug therapy: Secondary | ICD-10-CM | POA: Diagnosis not present

## 2023-04-20 DIAGNOSIS — Z7901 Long term (current) use of anticoagulants: Secondary | ICD-10-CM | POA: Insufficient documentation

## 2023-04-20 DIAGNOSIS — D500 Iron deficiency anemia secondary to blood loss (chronic): Secondary | ICD-10-CM

## 2023-04-20 LAB — CMP (CANCER CENTER ONLY)
ALT: 22 U/L (ref 0–44)
AST: 22 U/L (ref 15–41)
Albumin: 3.8 g/dL (ref 3.5–5.0)
Alkaline Phosphatase: 75 U/L (ref 38–126)
Anion gap: 8 (ref 5–15)
BUN: 9 mg/dL (ref 6–20)
CO2: 27 mmol/L (ref 22–32)
Calcium: 7.9 mg/dL — ABNORMAL LOW (ref 8.9–10.3)
Chloride: 104 mmol/L (ref 98–111)
Creatinine: 0.81 mg/dL (ref 0.44–1.00)
GFR, Estimated: >60 mL/min (ref >60)
Glucose, Bld: 121 mg/dL — ABNORMAL HIGH (ref 70–99)
Potassium: 3.4 mmol/L — ABNORMAL LOW (ref 3.5–5.1)
Sodium: 139 mmol/L (ref 135–145)
Total Bilirubin: 0.5 mg/dL (ref 0.3–1.2)
Total Protein: 5.5 g/dL — ABNORMAL LOW (ref 6.5–8.1)

## 2023-04-20 LAB — RETICULOCYTES
Immature Retic Fract: 7.6 % (ref 2.3–15.9)
RBC.: 4.03 MIL/uL (ref 3.87–5.11)
Retic Count, Absolute: 70.1 10*3/uL (ref 19.0–186.0)
Retic Ct Pct: 1.7 % (ref 0.4–3.1)

## 2023-04-20 LAB — CBC WITH DIFFERENTIAL (CANCER CENTER ONLY)
Abs Immature Granulocytes: 0.1 10*3/uL — ABNORMAL HIGH (ref 0.00–0.07)
Basophils Absolute: 0.1 10*3/uL (ref 0.0–0.1)
Basophils Relative: 1 %
Eosinophils Absolute: 0.2 10*3/uL (ref 0.0–0.5)
Eosinophils Relative: 3 %
HCT: 37.5 % (ref 36.0–46.0)
Hemoglobin: 12.6 g/dL (ref 12.0–15.0)
Immature Granulocytes: 1 %
Lymphocytes Relative: 16 %
Lymphs Abs: 1.2 10*3/uL (ref 0.7–4.0)
MCH: 31.3 pg (ref 26.0–34.0)
MCHC: 33.6 g/dL (ref 30.0–36.0)
MCV: 93.3 fL (ref 80.0–100.0)
Monocytes Absolute: 0.4 10*3/uL (ref 0.1–1.0)
Monocytes Relative: 6 %
Neutro Abs: 5.4 10*3/uL (ref 1.7–7.7)
Neutrophils Relative %: 73 %
Platelet Count: 220 10*3/uL (ref 150–400)
RBC: 4.02 MIL/uL (ref 3.87–5.11)
RDW: 12.7 % (ref 11.5–15.5)
WBC Count: 7.3 10*3/uL (ref 4.0–10.5)
nRBC: 0 % (ref 0.0–0.2)

## 2023-04-20 LAB — IRON AND IRON BINDING CAPACITY (CC-WL,HP ONLY)
Iron: 79 ug/dL (ref 28–170)
Saturation Ratios: 26 % (ref 10.4–31.8)
TIBC: 302 ug/dL (ref 250–450)
UIBC: 223 ug/dL (ref 148–442)

## 2023-04-20 LAB — LACTATE DEHYDROGENASE: LDH: 225 U/L — ABNORMAL HIGH (ref 98–192)

## 2023-04-20 LAB — FERRITIN: Ferritin: 166 ng/mL (ref 11–307)

## 2023-04-20 NOTE — Progress Notes (Signed)
Hematology and Oncology Follow Up Visit  Clotene Hiser [id number] 04-06-1971 52 y.o. 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 Dils is here today for follow-up. She is doing well but does note some mild fatigue at times.  Has history of Lupus and sclerosing mesenteritis. Doing well on Methotrexate and Plaquenil and has not had any recent flares.   She reports no clotting episodes since her last visit.   No fever, chills, n/v, cough, rash, dizziness, SOB, chest pain, palpitations, abdominal pain or changes in bowel or bladder habits.  No swelling in her extremities.  She has carpal tunnel in both wrists and is followed by PCP.  No falls or syncope reported.  No blood loss, bruising or petechiae.  Appetite and hydration good.   Wt Readings from Last 3 Encounters:  04/15/22 148 lb (67.1 kg)  10/16/21 140 lb 12.8 oz (63.9 kg)  08/19/21 134 lb 7.7 oz (61 kg)   ECOG Performance Status: 0 - Asymptomatic  Medications:  Allergies as of 04/20/2023       Reactions   Doxycycline Swelling   Penicillins Hives   Penicillin G Rash        Medication List        Accurate as of April 20, 2023 11:44 AM. If you have any questions, ask your nurse or doctor.          albuterol (2.5 MG/3ML) 0.083% nebulizer solution Commonly known as: PROVENTIL Take 2.5 mg by nebulization every 6 (six) hours as needed.   ALPRAZolam 1 MG tablet Commonly known as: XANAX Take 1 mg by mouth 2 (two) times daily as needed.   amphetamine-dextroamphetamine 30 MG tablet Commonly known as: ADDERALL Take by mouth daily.   citalopram 20 MG tablet Commonly known as: CELEXA Take 40 mg by mouth daily.   FOLIC ACID PO folic acid   hydrocortisone 2.5 %  cream Apply to rash twice a day as needed   hydroxychloroquine 200 MG tablet Commonly known as: PLAQUENIL Take 200 mg by mouth daily.   methotrexate 2.5 MG tablet Commonly known as: RHEUMATREX Take 2.5 mg by mouth once a week. Caution:Chemotherapy. Protect from light. Take six tablets total of 15 mg by mouth once a week.   omeprazole 40 MG capsule Commonly known as: PRILOSEC Take 1 capsule by mouth daily.   ondansetron 4 MG disintegrating tablet Commonly known as: ZOFRAN-ODT Take 4 mg by mouth every 6 (six) hours as needed.        Allergies:  Allergies  Allergen Reactions   Doxycycline Swelling   Penicillins Hives   Penicillin G Rash    Past Medical History, Surgical history, Social history, and Family History were reviewed and updated.  Review of Systems: All other 10 point review of systems is negative.   Physical Exam:  vitals were not taken for this visit.   Wt Readings from Last 3 Encounters:  04/15/22 148 lb (67.1 kg)  10/16/21 140 lb 12.8 oz (63.9 kg)  08/19/21 134 lb 7.7 oz (61 kg)   Constitutional: Well appearing, AAx3 Ocular: Sclerae unicteric, pupils equal, round and reactive to light Ear-nose-throat: Oropharynx clear, dentition fair Lymphatic: No cervical or supraclavicular adenopathy Lungs no rales or rhonchi, good excursion  bilaterally Heart regular rate and rhythm, no murmur appreciated Abd soft, nontender, positive bowel sounds MSK no focal spinal tenderness, no joint edema Neuro: non-focal, well-oriented, appropriate affect   Lab Results  Component Value Date   WBC 7.3 04/20/2023   HGB 12.6 04/20/2023   HCT 37.5 04/20/2023   MCV 93.3 04/20/2023   PLT 220 04/20/2023   Lab Results  Component Value Date   FERRITIN 107 04/15/2022   IRON 70 04/15/2022   TIBC 307 04/15/2022   UIBC 237 04/15/2022   IRONPCTSAT 23 04/15/2022   Lab Results  Component Value Date   RETICCTPCT 1.7 04/20/2023   RBC 4.02 04/20/2023   No results found for:  "KPAFRELGTCHN", "LAMBDASER", "KAPLAMBRATIO" No results found for: "IGGSERUM", "IGA", "IGMSERUM" No results found for: "TOTALPROTELP", "ALBUMINELP", "A1GS", "A2GS", "BETS", "BETA2SER", "GAMS", "MSPIKE", "SPEI"   Chemistry      Component Value Date/Time   NA 137 04/15/2022 1355   K 3.7 04/15/2022 1355   CL 103 04/15/2022 1355   CO2 27 04/15/2022 1355   BUN 11 04/15/2022 1355   CREATININE 0.72 04/15/2022 1355      Component Value Date/Time   CALCIUM 8.7 (L) 04/15/2022 1355   ALKPHOS 73 04/15/2022 1355   AST 16 04/15/2022 1355   ALT 15 04/15/2022 1355   BILITOT 0.4 04/15/2022 1355       Impression and Plan: Ms. 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 She completed treatment with anticoagulation. Stable splenic infarcts noted on MRI February 2022. No additional imaging needed at this time. No additional clotting events since.   Iron studies are pending.   RTC 1 year MD/APP, labs (in system)  Brand Males Grant, [place] 7/29/202411:44 AM

## 2023-08-12 ENCOUNTER — Other Ambulatory Visit: Payer: Self-pay | Admitting: Family

## 2023-08-12 DIAGNOSIS — Z1231 Encounter for screening mammogram for malignant neoplasm of breast: Secondary | ICD-10-CM

## 2023-08-18 ENCOUNTER — Ambulatory Visit
Admission: RE | Admit: 2023-08-18 | Discharge: 2023-08-18 | Disposition: A | Discharge status: Home / Self Care | Source: Ambulatory Visit | Attending: Family | Admitting: Family

## 2023-08-18 DIAGNOSIS — Z1231 Encounter for screening mammogram for malignant neoplasm of breast: Secondary | ICD-10-CM

## 2024-04-18 NOTE — Telephone Encounter (Signed)
 Patient's identity confirmed x 2 identifiers  Pt advised lab results faxed to [facility] at her request.  Voiced understanding.

## 2024-04-19 ENCOUNTER — Inpatient Hospital Stay: Admitting: Medical Oncology

## 2024-04-19 ENCOUNTER — Inpatient Hospital Stay: Attending: Family

## 2024-04-22 ENCOUNTER — Inpatient Hospital Stay: Attending: Family

## 2024-04-22 ENCOUNTER — Encounter: Payer: Self-pay | Admitting: Family

## 2024-04-22 ENCOUNTER — Inpatient Hospital Stay (HOSPITAL_BASED_OUTPATIENT_CLINIC_OR_DEPARTMENT_OTHER): Admitting: Family

## 2024-04-22 ENCOUNTER — Other Ambulatory Visit: Payer: Self-pay | Admitting: Family

## 2024-04-22 VITALS — BP 116/58 | HR 91 | Temp 99.0°F | Resp 20 | Ht 64.0 in | Wt 133.8 lb

## 2024-04-22 DIAGNOSIS — D500 Iron deficiency anemia secondary to blood loss (chronic): Secondary | ICD-10-CM

## 2024-04-22 DIAGNOSIS — Z7901 Long term (current) use of anticoagulants: Secondary | ICD-10-CM | POA: Insufficient documentation

## 2024-04-22 DIAGNOSIS — T82868S Thrombosis of vascular prosthetic devices, implants and grafts, sequela: Secondary | ICD-10-CM | POA: Diagnosis not present

## 2024-04-22 DIAGNOSIS — Z79899 Other long term (current) drug therapy: Secondary | ICD-10-CM | POA: Diagnosis not present

## 2024-04-22 DIAGNOSIS — D735 Infarction of spleen: Secondary | ICD-10-CM | POA: Insufficient documentation

## 2024-04-22 DIAGNOSIS — D509 Iron deficiency anemia, unspecified: Secondary | ICD-10-CM | POA: Diagnosis not present

## 2024-04-22 LAB — CBC WITH DIFFERENTIAL (CANCER CENTER ONLY)
Abs Immature Granulocytes: 0.05 K/uL (ref 0.00–0.07)
Basophils Absolute: 0.1 K/uL (ref 0.0–0.1)
Basophils Relative: 1 %
Eosinophils Absolute: 0.1 K/uL (ref 0.0–0.5)
Eosinophils Relative: 1 %
HCT: 42.7 % (ref 36.0–46.0)
Hemoglobin: 14.9 g/dL (ref 12.0–15.0)
Immature Granulocytes: 1 %
Lymphocytes Relative: 10 %
Lymphs Abs: 0.8 K/uL (ref 0.7–4.0)
MCH: 32.2 pg (ref 26.0–34.0)
MCHC: 34.9 g/dL (ref 30.0–36.0)
MCV: 92.2 fL (ref 80.0–100.0)
Monocytes Absolute: 0.5 K/uL (ref 0.1–1.0)
Monocytes Relative: 6 %
Neutro Abs: 6.3 K/uL (ref 1.7–7.7)
Neutrophils Relative %: 81 %
Platelet Count: 293 K/uL (ref 150–400)
RBC: 4.63 MIL/uL (ref 3.87–5.11)
RDW: 12.4 % (ref 11.5–15.5)
WBC Count: 7.7 K/uL (ref 4.0–10.5)
nRBC: 0 % (ref 0.0–0.2)

## 2024-04-22 LAB — CMP (CANCER CENTER ONLY)
ALT: 32 U/L (ref 0–44)
AST: 29 U/L (ref 15–41)
Albumin: 4 g/dL (ref 3.5–5.0)
Alkaline Phosphatase: 71 U/L (ref 38–126)
Anion gap: 11 (ref 5–15)
BUN: 8 mg/dL (ref 6–20)
CO2: 25 mmol/L (ref 22–32)
Calcium: 8.9 mg/dL (ref 8.9–10.3)
Chloride: 104 mmol/L (ref 98–111)
Creatinine: 0.75 mg/dL (ref 0.44–1.00)
GFR, Estimated: >60 mL/min (ref >60)
Glucose, Bld: 104 mg/dL — ABNORMAL HIGH (ref 70–99)
Potassium: 4.3 mmol/L (ref 3.5–5.1)
Sodium: 140 mmol/L (ref 135–145)
Total Bilirubin: 0.7 mg/dL (ref 0.0–1.2)
Total Protein: 6 g/dL — ABNORMAL LOW (ref 6.5–8.1)

## 2024-04-22 LAB — IRON AND IRON BINDING CAPACITY (CC-WL,HP ONLY)
Iron: 109 ug/dL (ref 28–170)
Saturation Ratios: 28 % (ref 10.4–31.8)
TIBC: 396 ug/dL (ref 250–450)
UIBC: 287 ug/dL

## 2024-04-22 LAB — FERRITIN: Ferritin: 201 ng/mL (ref 11–307)

## 2024-04-22 NOTE — Progress Notes (Signed)
 Hematology and Oncology Follow Up Visit  Amanda Mcconnell [id number] 08/20/1971 53 y.o. 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 She is feeling fatigued at times.  She has not noted any blood loss. No bruising or petechiae.  No fever, chills, n/v, cough, rash, dizziness, SOB, chest pain, palpitations, abdominal pain or changes in bowel or bladder habits.  Some swelling in feet that comes and goes. She states that her rheumatologist is following this issue.  No falls or syncope reported.  Appetite is fair and hydration is good. Weight is stable at 133 lbs.   ECOG Performance Status: 1 - Symptomatic but completely ambulatory  Medications:  Allergies as of 04/22/2024       Reactions   Doxycycline Swelling   Penicillins Hives   Penicillin G Rash        Medication List        Accurate as of April 22, 2024  3:02 PM. If you have any questions, ask your nurse or doctor.          STOP taking these medications    ondansetron 4 MG disintegrating tablet Commonly known as: ZOFRAN-ODT Stopped by: Lauraine Pepper       TAKE these medications    albuterol (2.5 MG/3ML) 0.083% nebulizer solution Commonly known as: PROVENTIL Take 2.5 mg by nebulization every 6 (six) hours as needed.   ALPRAZolam 1 MG tablet Commonly known as: XANAX Take 1 mg by mouth 2 (two) times daily as needed.   amphetamine-dextroamphetamine 30 MG tablet Commonly known as: ADDERALL Take by mouth daily.   ascorbic acid 1000 MG tablet Commonly known as: VITAMIN C Take 1,000 mg by mouth daily.   Calcium 600 600 MG Tabs tablet Generic drug: calcium carbonate Take 600 mg by mouth 2 (two) times daily with a meal.   cholecalciferol  25 MCG (1000 UNIT) tablet Commonly known as: VITAMIN D3 Take 1,000 Units by mouth daily.   citalopram 20 MG tablet Commonly known as: CELEXA Take 40 mg by mouth daily.   FOLIC ACID PO folic acid   hydrocortisone 2.5 % cream Apply to rash twice a day as needed   hydroxychloroquine 200 MG tablet Commonly known as: PLAQUENIL Take 200 mg by mouth daily.   methotrexate 2.5 MG tablet Commonly known as: RHEUMATREX Take 2.5 mg by mouth once a week. Caution:Chemotherapy. Protect from light. Take six tablets total of 15 mg by mouth once a week. What changed: additional instructions   Methylcobalamin 1 MG Chew Take 1 tab(s) orally once a day   multivitamin-prenatal 27-0.8 MG Tabs tablet Take 1 tablet by mouth daily at 12 noon.   omeprazole  40 MG capsule Commonly known as: PRILOSEC Take 1 capsule by mouth daily.   potassium gluconate 595 (99 K) MG Tabs tablet Take 595 mg by mouth daily.   Premarin vaginal cream Generic drug: conjugated estrogens Take 0.5g application intravaginally three times a week   rosuvastatin 5 MG tablet Commonly known as: CRESTOR Take 5 mg by mouth daily.   valACYclovir 500 MG tablet Commonly known as: VALTREX Take 500 mg by mouth 2 (two) times daily.        Allergies:  Allergies  Allergen  Reactions   Doxycycline Swelling   Penicillins Hives   Penicillin G Rash    Past Medical History, Surgical history, Social history, and Family History were reviewed and updated.  Review of Systems: All other 10 point review of systems is negative.   Physical Exam:  height is 5' 4 (1.626 m) and weight is 133 lb 12.8 oz (60.7 kg). Her oral temperature is 99 F (37.2 C). Her blood pressure is 116/58 (abnormal) and her pulse is 91. Her respiration is 20 and oxygen saturation is 100%.   Wt Readings from Last 3 Encounters:  04/22/24 133 lb 12.8 oz (60.7 kg)  04/20/23 133 lb 0.6 oz (60.3 kg)  04/15/22 148 lb (67.1 kg)    Ocular: Sclerae unicteric,  pupils equal, round and reactive to light Ear-nose-throat: Oropharynx clear, dentition fair Lymphatic: No cervical or supraclavicular adenopathy Lungs no rales or rhonchi, good excursion bilaterally Heart regular rate and rhythm, no murmur appreciated Abd soft, nontender, positive bowel sounds MSK no focal spinal tenderness, no joint edema Neuro: non-focal, well-oriented, appropriate affect Breasts: Deferred   Lab Results  Component Value Date   WBC 7.3 04/20/2023   HGB 12.6 04/20/2023   HCT 37.5 04/20/2023   MCV 93.3 04/20/2023   PLT 220 04/20/2023   Lab Results  Component Value Date   FERRITIN 166 04/20/2023   IRON 79 04/20/2023   TIBC 302 04/20/2023   UIBC 223 04/20/2023   IRONPCTSAT 26 04/20/2023   Lab Results  Component Value Date   RETICCTPCT 1.7 04/20/2023   RBC 4.02 04/20/2023   No results found for: KPAFRELGTCHN, LAMBDASER, KAPLAMBRATIO No results found for: IGGSERUM, IGA, IGMSERUM No results found for: STEPHANY CARLOTA BENSON MARKEL EARLA JOANNIE DOC VICK, SPEI   Chemistry      Component Value Date/Time   NA 139 04/20/2023 1116   K 3.4 (L) 04/20/2023 1116   CL 104 04/20/2023 1116   CO2 27 04/20/2023 1116   BUN 9 04/20/2023 1116   CREATININE 0.81 04/20/2023 1116      Component Value Date/Time   CALCIUM 7.9 (L) 04/20/2023 1116   ALKPHOS 75 04/20/2023 1116   AST 22 04/20/2023 1116   ALT 22 04/20/2023 1116   BILITOT 0.5 04/20/2023 1116       Impression and Plan: Amanda Mcconnell is a very pleasant 53 yo caucasian female with splenic infarcts and 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  She completed treatment with anticoagulation.  Stable splenic infarcts noted on MRI February 2022. Will repeat imaging if she becomes symptomatic.  Iron studies are pending. We will replace if needed.  Follow-up in 1 year.   Lauraine Pepper, NP 8/1/20253:02 PM

## 2024-08-09 ENCOUNTER — Other Ambulatory Visit (HOSPITAL_BASED_OUTPATIENT_CLINIC_OR_DEPARTMENT_OTHER): Payer: Self-pay | Admitting: Family

## 2024-08-09 DIAGNOSIS — Z720 Tobacco use: Secondary | ICD-10-CM

## 2024-08-16 ENCOUNTER — Inpatient Hospital Stay (HOSPITAL_BASED_OUTPATIENT_CLINIC_OR_DEPARTMENT_OTHER): Admission: RE | Admit: 2024-08-16 | Discharge: 2024-08-16 | Attending: Family | Admitting: Family

## 2024-08-16 DIAGNOSIS — F17210 Nicotine dependence, cigarettes, uncomplicated: Secondary | ICD-10-CM | POA: Diagnosis not present

## 2024-08-16 DIAGNOSIS — Z720 Tobacco use: Secondary | ICD-10-CM

## 2024-08-26 ENCOUNTER — Other Ambulatory Visit: Payer: Self-pay | Admitting: Family

## 2024-08-26 DIAGNOSIS — Z1231 Encounter for screening mammogram for malignant neoplasm of breast: Secondary | ICD-10-CM

## 2024-08-30 ENCOUNTER — Inpatient Hospital Stay: Admission: RE | Admit: 2024-08-30 | Discharge: 2024-08-30 | Attending: Family | Admitting: Family

## 2024-08-30 DIAGNOSIS — Z1231 Encounter for screening mammogram for malignant neoplasm of breast: Secondary | ICD-10-CM

## 2025-04-24 ENCOUNTER — Ambulatory Visit: Admitting: Family

## 2025-04-24 ENCOUNTER — Inpatient Hospital Stay
